# Patient Record
Sex: Female | Born: 1940 | Race: White | Hispanic: No | Smoking: Never smoker
Health system: Southern US, Community
[De-identification: ages and names within clinical notes are randomized; demographics above are authoritative.]

## PROBLEM LIST (undated history)

## (undated) DIAGNOSIS — I639 Cerebral infarction, unspecified: Secondary | ICD-10-CM

## (undated) DIAGNOSIS — I6381 Other cerebral infarction due to occlusion or stenosis of small artery: Secondary | ICD-10-CM

## (undated) DIAGNOSIS — R001 Bradycardia, unspecified: Secondary | ICD-10-CM

## (undated) DIAGNOSIS — F0390 Unspecified dementia without behavioral disturbance: Secondary | ICD-10-CM

## (undated) DIAGNOSIS — G250 Essential tremor: Secondary | ICD-10-CM

## (undated) DIAGNOSIS — G629 Polyneuropathy, unspecified: Secondary | ICD-10-CM

## (undated) DIAGNOSIS — F419 Anxiety disorder, unspecified: Secondary | ICD-10-CM

## (undated) DIAGNOSIS — R569 Unspecified convulsions: Secondary | ICD-10-CM

## (undated) DIAGNOSIS — I10 Essential (primary) hypertension: Secondary | ICD-10-CM

## (undated) DIAGNOSIS — F4323 Adjustment disorder with mixed anxiety and depressed mood: Secondary | ICD-10-CM

## (undated) DIAGNOSIS — E119 Type 2 diabetes mellitus without complications: Secondary | ICD-10-CM

## (undated) DIAGNOSIS — M069 Rheumatoid arthritis, unspecified: Secondary | ICD-10-CM

## (undated) HISTORY — DX: Unspecified convulsions: R56.9

## (undated) HISTORY — DX: Essential (primary) hypertension: I10

## (undated) HISTORY — DX: Essential tremor: G25.0

## (undated) HISTORY — PX: SMALL INTESTINE SURGERY: SHX150

## (undated) HISTORY — DX: Unspecified dementia, unspecified severity, without behavioral disturbance, psychotic disturbance, mood disturbance, and anxiety: F03.90

## (undated) HISTORY — DX: Polyneuropathy, unspecified: G62.9

## (undated) HISTORY — DX: Adjustment disorder with mixed anxiety and depressed mood: F43.23

## (undated) HISTORY — DX: Type 2 diabetes mellitus without complications: E11.9

## (undated) HISTORY — DX: Bradycardia, unspecified: R00.1

## (undated) HISTORY — DX: Anxiety disorder, unspecified: F41.9

## (undated) HISTORY — DX: Rheumatoid arthritis, unspecified: M06.9

## (undated) HISTORY — DX: Other cerebral infarction due to occlusion or stenosis of small artery: I63.81

## (undated) HISTORY — DX: Cerebral infarction, unspecified: I63.9

---

## 1973-04-08 HISTORY — PX: OTHER SURGICAL HISTORY: SHX169

## 2019-04-09 DIAGNOSIS — G20A1 Parkinson's disease without dyskinesia, without mention of fluctuations: Secondary | ICD-10-CM

## 2019-04-09 DIAGNOSIS — G3183 Dementia with Lewy bodies: Secondary | ICD-10-CM | POA: Insufficient documentation

## 2019-04-09 DIAGNOSIS — F0280 Dementia in other diseases classified elsewhere without behavioral disturbance: Secondary | ICD-10-CM | POA: Insufficient documentation

## 2019-04-09 DIAGNOSIS — G20 Parkinson's disease: Secondary | ICD-10-CM

## 2019-04-09 HISTORY — DX: Parkinson's disease: G20

## 2019-04-09 HISTORY — DX: Parkinson's disease without dyskinesia, without mention of fluctuations: G20.A1

## 2020-09-12 DIAGNOSIS — F419 Anxiety disorder, unspecified: Secondary | ICD-10-CM | POA: Diagnosis not present

## 2020-09-12 DIAGNOSIS — J45909 Unspecified asthma, uncomplicated: Secondary | ICD-10-CM | POA: Diagnosis not present

## 2020-09-12 DIAGNOSIS — I10 Essential (primary) hypertension: Secondary | ICD-10-CM | POA: Diagnosis not present

## 2020-09-12 DIAGNOSIS — G20 Parkinson's disease: Secondary | ICD-10-CM | POA: Diagnosis not present

## 2020-09-12 DIAGNOSIS — K219 Gastro-esophageal reflux disease without esophagitis: Secondary | ICD-10-CM | POA: Diagnosis not present

## 2020-09-12 DIAGNOSIS — R569 Unspecified convulsions: Secondary | ICD-10-CM | POA: Diagnosis not present

## 2020-09-12 DIAGNOSIS — I679 Cerebrovascular disease, unspecified: Secondary | ICD-10-CM | POA: Diagnosis not present

## 2020-09-12 DIAGNOSIS — I2510 Atherosclerotic heart disease of native coronary artery without angina pectoris: Secondary | ICD-10-CM | POA: Diagnosis not present

## 2020-09-12 DIAGNOSIS — E7800 Pure hypercholesterolemia, unspecified: Secondary | ICD-10-CM | POA: Diagnosis not present

## 2020-10-11 DIAGNOSIS — K219 Gastro-esophageal reflux disease without esophagitis: Secondary | ICD-10-CM | POA: Diagnosis not present

## 2020-10-11 DIAGNOSIS — I679 Cerebrovascular disease, unspecified: Secondary | ICD-10-CM | POA: Diagnosis not present

## 2020-10-11 DIAGNOSIS — G20 Parkinson's disease: Secondary | ICD-10-CM | POA: Diagnosis not present

## 2020-10-11 DIAGNOSIS — I2510 Atherosclerotic heart disease of native coronary artery without angina pectoris: Secondary | ICD-10-CM | POA: Diagnosis not present

## 2020-10-11 DIAGNOSIS — R69 Illness, unspecified: Secondary | ICD-10-CM | POA: Diagnosis not present

## 2020-10-11 DIAGNOSIS — E1159 Type 2 diabetes mellitus with other circulatory complications: Secondary | ICD-10-CM | POA: Diagnosis not present

## 2020-10-11 DIAGNOSIS — R569 Unspecified convulsions: Secondary | ICD-10-CM | POA: Diagnosis not present

## 2020-10-11 DIAGNOSIS — E7800 Pure hypercholesterolemia, unspecified: Secondary | ICD-10-CM | POA: Diagnosis not present

## 2020-10-11 DIAGNOSIS — I10 Essential (primary) hypertension: Secondary | ICD-10-CM | POA: Diagnosis not present

## 2020-10-11 DIAGNOSIS — J45909 Unspecified asthma, uncomplicated: Secondary | ICD-10-CM | POA: Diagnosis not present

## 2020-10-25 DIAGNOSIS — R69 Illness, unspecified: Secondary | ICD-10-CM | POA: Diagnosis not present

## 2020-11-02 DIAGNOSIS — I10 Essential (primary) hypertension: Secondary | ICD-10-CM | POA: Diagnosis not present

## 2020-11-02 DIAGNOSIS — Z794 Long term (current) use of insulin: Secondary | ICD-10-CM | POA: Diagnosis not present

## 2020-11-02 DIAGNOSIS — E119 Type 2 diabetes mellitus without complications: Secondary | ICD-10-CM | POA: Diagnosis not present

## 2020-11-02 DIAGNOSIS — H25813 Combined forms of age-related cataract, bilateral: Secondary | ICD-10-CM | POA: Diagnosis not present

## 2020-11-02 DIAGNOSIS — H524 Presbyopia: Secondary | ICD-10-CM | POA: Diagnosis not present

## 2020-11-02 DIAGNOSIS — H52223 Regular astigmatism, bilateral: Secondary | ICD-10-CM | POA: Diagnosis not present

## 2020-11-08 DIAGNOSIS — K219 Gastro-esophageal reflux disease without esophagitis: Secondary | ICD-10-CM | POA: Diagnosis not present

## 2020-11-08 DIAGNOSIS — G20 Parkinson's disease: Secondary | ICD-10-CM | POA: Diagnosis not present

## 2020-11-08 DIAGNOSIS — E7800 Pure hypercholesterolemia, unspecified: Secondary | ICD-10-CM | POA: Diagnosis not present

## 2020-11-08 DIAGNOSIS — R69 Illness, unspecified: Secondary | ICD-10-CM | POA: Diagnosis not present

## 2020-11-08 DIAGNOSIS — I679 Cerebrovascular disease, unspecified: Secondary | ICD-10-CM | POA: Diagnosis not present

## 2020-11-08 DIAGNOSIS — I10 Essential (primary) hypertension: Secondary | ICD-10-CM | POA: Diagnosis not present

## 2020-11-08 DIAGNOSIS — I2510 Atherosclerotic heart disease of native coronary artery without angina pectoris: Secondary | ICD-10-CM | POA: Diagnosis not present

## 2020-11-08 DIAGNOSIS — E1159 Type 2 diabetes mellitus with other circulatory complications: Secondary | ICD-10-CM | POA: Diagnosis not present

## 2020-11-08 DIAGNOSIS — R569 Unspecified convulsions: Secondary | ICD-10-CM | POA: Diagnosis not present

## 2020-11-08 DIAGNOSIS — J45909 Unspecified asthma, uncomplicated: Secondary | ICD-10-CM | POA: Diagnosis not present

## 2020-11-15 ENCOUNTER — Encounter: Payer: Self-pay | Admitting: Neurology

## 2020-11-15 ENCOUNTER — Ambulatory Visit: Payer: Medicare HMO | Admitting: Neurology

## 2020-11-15 ENCOUNTER — Telehealth: Payer: Self-pay | Admitting: Neurology

## 2020-11-15 VITALS — BP 128/75 | HR 64 | Ht 66.0 in | Wt 133.0 lb

## 2020-11-15 DIAGNOSIS — E079 Disorder of thyroid, unspecified: Secondary | ICD-10-CM | POA: Diagnosis not present

## 2020-11-15 DIAGNOSIS — W19XXXA Unspecified fall, initial encounter: Secondary | ICD-10-CM

## 2020-11-15 DIAGNOSIS — G250 Essential tremor: Secondary | ICD-10-CM

## 2020-11-15 DIAGNOSIS — Z131 Encounter for screening for diabetes mellitus: Secondary | ICD-10-CM | POA: Diagnosis not present

## 2020-11-15 DIAGNOSIS — R269 Unspecified abnormalities of gait and mobility: Secondary | ICD-10-CM

## 2020-11-15 DIAGNOSIS — G309 Alzheimer's disease, unspecified: Secondary | ICD-10-CM | POA: Diagnosis not present

## 2020-11-15 DIAGNOSIS — E538 Deficiency of other specified B group vitamins: Secondary | ICD-10-CM | POA: Diagnosis not present

## 2020-11-15 DIAGNOSIS — R69 Illness, unspecified: Secondary | ICD-10-CM | POA: Diagnosis not present

## 2020-11-15 DIAGNOSIS — G20 Parkinson's disease: Secondary | ICD-10-CM

## 2020-11-15 DIAGNOSIS — R413 Other amnesia: Secondary | ICD-10-CM | POA: Diagnosis not present

## 2020-11-15 DIAGNOSIS — R799 Abnormal finding of blood chemistry, unspecified: Secondary | ICD-10-CM | POA: Diagnosis not present

## 2020-11-15 DIAGNOSIS — E519 Thiamine deficiency, unspecified: Secondary | ICD-10-CM | POA: Diagnosis not present

## 2020-11-15 DIAGNOSIS — E539 Vitamin B deficiency, unspecified: Secondary | ICD-10-CM | POA: Diagnosis not present

## 2020-11-15 DIAGNOSIS — F0391 Unspecified dementia with behavioral disturbance: Secondary | ICD-10-CM | POA: Diagnosis not present

## 2020-11-15 DIAGNOSIS — R251 Tremor, unspecified: Secondary | ICD-10-CM
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
## 2020-11-15 NOTE — Patient Instructions (Addendum)
MRI of the brain FDG PET Scan  Blood work Formal neurocognitive testing  Review the different dementias below, it is unclear which diagnosis until the testing above:   Lewy Body Dementia Lewy body dementia, also called dementia with Lewy bodies, is a condition that affects the way the brain functions. It is one form of dementia. In this condition, proteins called Lewy bodies build up in certain areas of the brain. This causes problems with: Memory. Decision making. Behavior. Speaking. Thinking and problem solving. Movements and balance. This condition is progressive, which means that it gets worse with time (is degenerative) and cannot be reversed. What are the causes? This condition is caused by the buildup of Lewy bodies in brain cells in areas of the brain that control memory, thinking, and movement. It is not known whatcauses the Lewy bodies to build up. What increases the risk? You are more likely to develop this condition if you: Have a family history of Lewy body dementia or Parkinson's disease. Are 30 years old or older. Are female. What are the signs or symptoms? Symptoms of this condition may include: Seeing things that are not there (hallucinating). Sleep problems, such as acting out dreams while you are asleep. Changes in memory, attention, and concentration that come and go (fluctuate). Symptoms of dementia, such as: Trouble with memory. Trouble paying attention. Problems with planning and organizing. Problems with judgment. Behavioral problems. Symptoms of Parkinson's disease, such as: Shaking movements that you cannot control (tremor). Tremors usually start in a hand or foot when you are resting (resting tremor). Stooped posture. Slowing of movement. Stiff muscles (rigidity). Loss of balance and stability when standing. How is this diagnosed? This condition is diagnosed by a specialist who diagnoses and treats this condition (neurologist). Your health care  provider will talk with you and your family, friends, orcaregivers about your history and symptoms. A thorough medical history will be taken, and you will have a physical exam and tests. Tests may include: Lab tests, such as blood or urine tests. Imaging tests, such as a CT scan, a PET scan, or an MRI. A test that involves removing and testing a small amount of the fluid that surrounds the brain and spinal cord (lumbar puncture). Tests that evaluate brain function, such as memory tests, cognitive tests, and neuropsychological tests. How is this treated? There is no cure for this condition. Treatment focuses on managing your symptoms. Treatment may include: Medicines to help with hallucinations, sleep, and behavioral problems. Speech, occupational, and physical therapy. Your health care provider can help direct you to support groups, organizations,and other health care providers who can help with decisions about your care. Follow these instructions at home: Medicines Take over-the-counter and prescription medicines only as told by your health care provider. To help you manage your medicines, use a pill organizer or pill reminder. Avoid taking medicines that can affect thinking, such as pain medicines or certain sleeping medicines. Always check with your health care provider before taking any new medicines. Lifestyle Make healthy lifestyle choices: Be physically active as told by your health care provider. Do not use any products that contain nicotine or tobacco, such as cigarettes, e-cigarettes, and chewing tobacco. If you need help quitting, ask your health care provider. Try to practice stress-management techniques when you experience stress, such as mindfulness, yoga, or deep breathing. Stay socially connected. Talk regularly with other people, such as family, friends, and neighbors. Make sure you sleep well. These tips can help you get a good night's rest:  Avoid napping during the  day. Keep your sleeping area dark and cool. Avoid exercising a few hours before you go to bed. Avoid caffeine products in the evening. Eating and drinking Do not drink alcohol. Drink enough fluid to keep your urine pale yellow. Eat a healthy diet. Safety  Work with your health care provider to determine what you need help with and what your safety needs are. If you have trouble moving around, use a cane or walker as told by your health care provider. Make sure your home environment is safe. To do this: Remove things that can be a tripping hazard, such as throw rugs or clutter. Install grab bars and railings in your home to prevent falls. Talk with your health care provider about whether it is safe for you to drive. If you were given a bracelet that identifies you as a person with memory loss or tracks your location, make sure to wear it at all times.  General instructions  Work with your family to make important decisions, such as advance directives, medical power of attorney, or a living will. Keep all follow-up visits. This is important.  Where to find more information [employer]: [employer] Contact a health care provider if you have: New or worsening confusion. New or worsening trouble with sleeping or increased daytime sleepiness. Problems with choking or swallowing. Get help right away if: You feel depressed or sad, or feel that you want to harm yourself. Your family members become concerned for your safety. If you ever feel like you may hurt yourself or others, or have thoughts about taking your own life, get help right away. Go to your nearest emergency department or: Call your local emergency services (911 in the [place] Call a suicide crisis helpline, such as the National Suicide Prevention Lifeline at 914-175-8979. This is open 24 hours a day in the [place] Text the Crisis Text Line at (865)245-7938 (in the [place] Summary Lewy  body dementia is a condition that affects the way the brain functions. It causes problems with functions such as memory and thinking. Lewy body dementia gets worse with time. This condition is caused by the buildup of proteins called Lewy bodies in brain cells. It is not known what causes the Lewy bodies to build up. Work with your health care provider to determine what you need help with and what your safety needs are. Your health care provider can help direct you to support groups, organizations, and other health care providers who can help with decisions about your care. This information is not intended to replace advice given to you by your health care provider. Make sure you discuss any questions you have with your healthcare provider. Document Revised: 08/09/2019 Document Reviewed: 08/09/2019 Elsevier Patient Education  2022 [employer] Frontotemporal Dementia  Frontotemporal dementia (FTD), sometimes called semantic dementia or Pick's disease, is a progressive brain disorder that causes memory loss. FTD describes a range of diseases that often start with changes in behavior, speech, and thinking. As FTD progresses, it affects short-term memory. Over time, FTD causes the frontal and temporal lobes of the brain to shrink. These are theparts of the brain that control behavior and speech. There are multiple types of FTD, including: Behavioral variant FTD. This is the most common type. Primary progressive aphasia. Frontotemporal dementia with motor neuron disease. FTD is one of the most common causes of progressive memory loss in people younger than age 40. The disease progresses faster in some people  than in others. In some families, FTD can be associated with amyotrophic lateral sclerosis (ALS). There is no cure for FTD, but treatment and supportive carecan improve a person's quality of life. What are the causes? The exact cause of this condition is not known, although many genetic changes  (mutations) are known to cause the disease. What increases the risk? This condition is more likely to occur in people who have a family history ofFTD. Family members of people with FTD should think about genetic counseling. What are the signs or symptoms? Symptoms of FTD usually start when a person is 51-8 years of age. Symptoms may include: Impulsive, poorly controlled, inappropriate, or embarrassing behavior. Lack of motivation and interest (apathy). Irritability and agitation. Neglect of personal hygiene. Withdrawal. Inappropriate crying or laughing (pseudobulbar affect). Repetitive behaviors. Impulsive eating. Lack of concern for others. Failure to recognize behavioral changes. Speech problems, such as: Loss of the ability to speak fluently. Slurred speech. Loss of vocabulary. Inability to write or read. New drug or alcohol abuse. Short-term memory loss is also a symptom later in the disease. How is this diagnosed? Your health care provider may suspect FTD if you have worsening behavior or speech difficulties. You may need to see specialists in brain and behavioral health who will collect your medical history and do a neurological exam. The following tests may be done: Blood tests to rule out other causes, such as vitamin deficiency, harmful effects of substances (toxicities), and infections. Spinal tap (lumbar puncture) to check spinal fluid samples for abnormal proteins. Imaging tests, such as a CT scan, a PET scan, or an MRI. These can show brain changes that suggest FTD or another brain disorder. Memory testing (neuropsychological testing), which involves several hours of standardized tests to check the many functions of the brain. How is this treated? There is no cure for this condition, and the progression of FTD cannot be stopped. Support at home is the most important aspect of managing FTD. Askabout caregiving resources in your community. Management of this condition may  include: Antidepressant medicines to help with apathy. Medicines to treat pseudobulbar affect. Sedative medicines to control aggressive or dangerous behavior. Speech and language therapy. Behavioral therapy. Occupational therapy to help with home safety and activity. Institutional or supportive care may eventually be needed. Follow these instructions at home: Eating and drinking Follow a healthy diet. Eat foods that are high in fiber, such as beans, whole grains, and fresh fruits and vegetables. Avoid having too much sugar and caffeine in your diet. Watch for signs of compulsive eating, which can lead to other health problems. Do not drink alcohol. Drink enough fluid to keep your urine pale yellow. Lifestyle Keep a regular routine. Avoid stress and new situations. Avoid any activities that may trigger aggressive behavior. Schedule regular, enjoyable, and supervised physical activity. General instructions Take over-the-counter and prescription medicines only as told by your health care provider. If you were given a bracelet that tracks your location, make sure you wear it. Work with your health care provider to determine what you need help with and what your safety needs are. Keep all follow-up visits, including any therapy visits. This is important. Where to find more information [employer]: [employer] Contact a health care provider if: Your symptoms change or get worse. It becomes more difficult or stressful to be cared for at home. Get help right away if: It is no longer possible to be cared for at home. You or your family  members become concerned for your safety. You threaten yourself or others. If you ever feel like you may hurt yourself or others, or have thoughts about taking your own life, get help right away. Go to your nearest emergency department or: Call your local emergency services (911 in the [place] Call a suicide  crisis helpline, such as the National Suicide Prevention Lifeline at 435-636-8458. This is open 24 hours a day in the [place] Text the Crisis Text Line at 902-725-7730 (in the [place] Summary Frontotemporal dementia (FTD) is a progressive brain disorder that causes memory loss. There is no cure for this condition, and the progression of FTD cannot be stopped. Support at home is the most important aspect of managing FTD. Ask about caregiving resources in your community. Work with your health care provider to determine what you need help with and what your safety needs are. This information is not intended to replace advice given to you by your health care provider. Make sure you discuss any questions you have with your healthcare provider. Document Revised: 08/09/2019 Document Reviewed: 08/09/2019 Elsevier Patient Education  2022 [employer]  Alzheimer's Disease Alzheimer's disease is a brain disease that affects memory, thinking, language, and behavior. People with Alzheimer's disease lose mental abilities, and [place] gets worse over time. Alzheimer's disease is a form of dementia. What are the causes? This condition develops when a protein called beta-amyloid forms deposits inthe brain. It is not known what causes these deposits to form. Alzheimer's disease may also be caused by a gene mutation that is inherited from one parent or both parents. A gene mutation is a harmful change in a gene.Not everyone who inherits the genetic mutation will get the disease. What increases the risk? You are more likely to develop this condition if you: Are older than age 73. Are female. Have any of these medical conditions: High blood pressure. Diabetes. Heart or blood vessel disease. Smoke. Have obesity. Have had a brain injury. Have had a stroke. Have a family history of dementia. What are the signs or symptoms? Symptoms of this condition may happen in three stages, which often overlap. Early  stage In this stage, you may continue to be independent. You may still be able to drive, work, and be social. Symptoms in this stage include: Minor memory problems, such as forgetting a name, words, or what you did recently. Difficulty with: Paying attention. Communicating. Doing familiar tasks. Problem solving or doing calculations. Following instructions. Learning new things. Anxiety. Social withdrawal. Loss of motivation. Moderate stage In this stage, you will start to need care. Symptoms in this stage include: Difficulty with expressing thoughts. Memory loss that affects daily life. This can include forgetting: Recent events that have happened. If you have taken medicines or eaten. Familiar places. You may get lost while walking or driving. To pay bills or manage finances. Personal hygiene such as bathing or using the bathroom. Confusion about where you are or what time it is. Difficulty in judging distance. Changes in personality, mood, and behavior. You may be moody, irritable, angry, frustrated, fearful, anxious, or suspicious. Poor reasoning and judgment. Delusions or hallucinations. Changes in sleep patterns. Severe stage In the final stage, you will need help with your personal care and daily activities. Symptoms in this stage include: Worsening memory loss. Personality changes. Loss of awareness of your surroundings. Changes in physical abilities, including the ability to walk, sit, and swallow. Difficulty in communicating. Inability to control your bladder and bowels. Increasing confusion. Increasing behavior changes. How  is this diagnosed?  This condition is diagnosed by a health care provider who specializes in diseases of the nervous system (neurologist) or one who specializes in care of the elderly (geriatrician or geriatric psychiatrist). Other causes of dementia may also be ruled out. Your health care provider will talk with you and your family, friends, or  caregivers about your historyand symptoms. A thorough medical history will be taken, and you will have a physical exam and tests. Tests may include: Lab tests, such as blood or urine tests. Imaging tests, such as a CT scan, a PET scan, or an MRI. A lumbar puncture. This test involves removing and testing a small amount of the fluid that surrounds the brain and spinal cord. An electroencephalogram (EEG). In this test, small metal discs are used to measure electrical activity in the brain. Memory tests, cognitive tests, and neuropsychological tests. These tests evaluate brain function. Genetic testing. This may be done if you have early onset of the disease (before age 66) or if other family members have the disease. How is this treated? At this time, there is no treatment to cure Alzheimer's disease or stop it from getting worse. The goals of treatment are: To manage behavioral changes. To provide you with a safe environment. To help manage daily life for you and your caregivers. The following treatment options are available: Medicines. Medicines may help the memory work better and manage behavioral symptoms. Cognitive therapy. Cognitive therapy provides you with education, support, and memory aids. It is most helpful in the early stages of the condition. Counseling or spiritual guidance. It is normal to have a lot of feelings, including anger, relief, fear, and isolation. Counseling and guidance can help you deal with these feelings. Caregiving. This involves having caregivers help you with your daily activities. Family support groups. These provide education, emotional support, and information about community resources to family members who are taking care of you. Follow these instructions at home:  Medicines Take over-the-counter and prescription medicines only as told by your health care provider. Use a pill organizer or pill reminder to help you manage your medicines. Avoid taking  medicines that can affect thinking, such as pain medicines or sleeping medicines. Lifestyle Make healthy lifestyle choices: Be physically active as told by your health care provider. Regular exercise may help improve symptoms. Do not use any products that contain nicotine or tobacco, such as cigarettes, e-cigarettes, and chewing tobacco. If you need help quitting, ask your health care provider. Do not drink alcohol. Eat a healthy diet. Practice stress-management techniques when you get stressed. Stay social. Drink enough fluid to keep your urine pale yellow. Make sure to get quality sleep. Avoid taking long naps during the day. Take short naps of 30 minutes or less if needed. Keep your sleeping area dark and cool. Avoid exercising during the few hours before you go to bed. Avoid caffeine products in the afternoon and evening. General instructions Work with your health care provider to determine what you need help with and what your safety needs are. If you were given a bracelet that identifies you as a person with memory loss or tracks your location, make sure to wear it at all times. Talk with your health care provider about whether it is safe for you to drive. Work with your family to make important decisions, such as advance directives, medical power of attorney, or a living will. Keep all follow-up visits. This is important. Where to find more information The [employer]: Call  the 24-hour helpline at 1-217 670 4529, or visit [employer] Contact a health care provider if: You have nausea, vomiting, or trouble with eating related to a medicine. You have worsening mood or behavior changes, such as depression, anxiety, or hallucinations. You or your family members become concerned for your safety. Get help right away if: You become less responsive or are difficult to wake up. Your memory suddenly gets worse. You feel that you want to harm yourself. If you ever feel like you  may hurt yourself or others, or have thoughts about taking your own life, get help right away. Go to your nearest emergency department or: Call your local emergency services (911 in the [place] Call a suicide crisis helpline, such as the National Suicide Prevention Lifeline at (303) 067-8480. This is open 24 hours a day in the [place] Text the Crisis Text Line at 7623666941 (in the [place] Summary Alzheimer's disease is a brain disease that affects memory, thinking, language, and behavior. Alzheimer's disease is a form of dementia. This condition is diagnosed by a specialist in diseases of the nervous system (neurologist) or one who specializes in care of the elderly. At this time, there is no treatment to cure Alzheimer's disease or stop it from getting worse. The goal of treatment is to help you manage any symptoms. Work with your family to make important decisions, such as advance directives, medical power of attorney, or a living will. This information is not intended to replace advice given to you by your health care provider. Make sure you discuss any questions you have with your healthcare provider. Document Revised: 07/12/2019 Document Reviewed: 07/12/2019 Elsevier Patient Education  2022 [employer]   Vascular Dementia Dementia is a condition in which a person has problems with thinking, memory, and behavior that are severe enough to interfere with daily life. Vascular dementia is a type of dementia. It results from brain damage that is caused by the brain not getting enough blood. This condition may also be called vascularcognitive impairment. What are the causes? Vascular dementia is caused by conditions that reduce blood flow to the brain. Common causes of this condition include: Multiple small strokes. These may happen without symptoms (silent stroke). Major stroke. Damage to small blood vessels in the brain (cerebral small vessel disease). What increases the risk? The following factors  may make you more likely to develop this condition: Having had a stroke. Having high blood pressure (hypertension) or high cholesterol. Having a disease that affects the heart or blood vessels. Smoking. Not being active. Being over age 75. Having any of these conditions: Diabetes. Metabolic syndrome. Obesity. Depression. A genetic condition that leads to stroke, such as CADASIL (cerebral autosomal dominant arteriopathy with subcortical infarcts and leukoencephalopathy). What are the signs or symptoms? Symptoms of vascular dementia can vary from one person to another. Symptoms may be mild or severe depending on the amount of damage and which parts of thebrain have been affected. Symptoms may begin suddenly or may develop slowly. Mental symptoms may include: Confusion and memory problems. Poor attention and concentration. Trouble understanding speech. Depression. Personality changes. Trouble recognizing familiar people. Agitation or aggression. Paranoia or false beliefs (delusions). Hallucinations. These involve seeing, hearing, tasting, smelling, or feeling things that are not real. Physical symptoms may include: Weakness. Poor balance. Loss of bladder or bowel control (incontinence). Unsteady walking (gait). Speaking problems. Behavioral symptoms may include: Getting lost in familiar places. Problems with planning and judgment. Trouble following instructions. Social problems. Emotional outbursts. Trouble with daily activities and self-care.  Problems handling money. Symptoms may remain stable, or they may get worse over time. Symptoms of vascular dementia may be similar to those of Alzheimer's disease. The two conditions can occur together (mixed dementia). How is this diagnosed? This condition may be diagnosed based on: Your medical history and a physical exam. Symptoms or changes that are reported by friends and family. Lab tests or other tests that check brain and  nervous system function. Tests may include: Blood tests. Brain imaging tests. Tests of movement, speech, and other daily activities (neurological exam). Tests of memory, thinking, and problem-solving (neuropsychological or neurocognitive testing). There is not a specific test to diagnose vascular dementia. Diagnosis may involve several specialists. These may include: A health care provider who specializes in the brain and nervous system (neurologist). A health care provider who specializes in understanding how problems in the brain can alter behavior and cognitive function (neuropsychologist). How is this treated? There is no cure for vascular dementia. Brain damage that has already occurred cannot be reversed. Treatment depends on: How severe the condition is. Which parts of your brain have been affected. Your overall health. Treatment measures aim to: Treat the underlying cause of vascular dementia and manage risk factors. This may include: Controlling blood pressure or lowering cholesterol. Treating diabetes. Making lifestyle changes, such as quitting smoking or losing weight. Manage symptoms. Prevent further brain damage. Improve the person's health and quality of life. Treatment for dementia may involve a team of health care providers, including: A neurologist. A provider who specializes in disorders of the mind (psychiatrist). A provider who specializes in helping people learn daily living skills (occupational therapist). A provider who focuses on speech and language changes (Doctor, general practice). A heart specialist (cardiologist). A provider who helps people learn how to manage physical changes, such as movement and walking (exercise physiologist or physical therapist). Follow these instructions at home: Medicines Take over-the-counter and prescription medicines only as told by your health care provider. Use a pill organizer or pill reminder to help you manage your  medicines. Lifestyle Do not use any products that contain nicotine or tobacco, such as cigarettes, e-cigarettes, and chewing tobacco. If you need help quitting, ask your health care provider. Eat a healthy, balanced diet. Maintain a healthy weight, or lose weight if needed. Be physically active as told by your health care provider. General instructions Work with your health care provider to determine what you need help with and what your safety needs are. Follow the health care provider's instructions for treating the condition that caused the dementia. Keep all follow-up visits. This is important. If you are the caregiver:  People with vascular dementia may need regular help at home or daily care froma family member or home health care worker. Home care for a person with vascular dementia depends on what caused the condition and how severe the symptoms are. General guidelines for caregivers include: Help the person with dementia remember people, appointments, and daily activities. Help the person with dementia manage his or her medicines. Help family and friends learn about ways to communicate with the person with dementia. Create a safe living space to reduce the risk of injury or falls. Find a support group to help caregivers and family cope with the effects of dementia.  Where to find more information [employer] of Neurological Disorders and Stroke: [employer] Contact a health care provider if: New behavioral problems develop. Problems with swallowing develop. Confusion gets worse. Sleepiness gets worse. Get help right away if: Loss of consciousness  occurs. There is a sudden loss of speech, balance, or thinking ability. New numbness or paralysis occurs. Sudden, severe headache occurs. Vision is lost or suddenly gets worse in one or both eyes. If you ever feel like the person with dementia may hurt himself or herself or others, or if he or she shares thoughts about  taking his or her own life, get help right away. You can go to your nearest emergency department or: Call your local emergency services (911 in the [place] Call a suicide crisis helpline, such as the National Suicide Prevention Lifeline at (608)626-2260. This is open 24 hours a day in the [place] Text the Crisis Text Line at 607-456-2790 (in the [place] Summary Vascular dementia is a type of dementia. It results from brain damage that is caused by the brain not getting enough blood. Vascular dementia is caused by conditions that reduce blood flow to the brain. Common causes of this condition include stroke and damage to small blood vessels in the brain. Treatment focuses on treating the underlying cause of vascular dementia and managing any risk factors. People with vascular dementia may need regular help at home or daily care from a family member or home health care worker. Contact a health care provider if you or your caregiver notices any new symptoms. This information is not intended to replace advice given to you by your health care provider. Make sure you discuss any questions you have with your healthcare provider. Document Revised: 08/09/2019 Document Reviewed: 08/09/2019 Elsevier Patient Education  2022 [employer].

## 2020-11-15 NOTE — Telephone Encounter (Signed)
[employer] medicare order sent to [facility]. They will obtain the auth and reach out to the patient to schedule.  °

## 2020-11-16 ENCOUNTER — Telehealth: Payer: Self-pay | Admitting: Neurology

## 2020-11-16 NOTE — Telephone Encounter (Signed)
[employer] medicare pending faxed notes  °

## 2020-11-18 LAB — COMPREHENSIVE METABOLIC PANEL
ALT: 7 IU/L (ref 0–32)
AST: 19 IU/L (ref 0–40)
Albumin/Globulin Ratio: 2.1 (ref 1.2–2.2)
Albumin: 4.7 g/dL (ref 3.7–4.7)
Alkaline Phosphatase: 76 IU/L (ref 44–121)
BUN/Creatinine Ratio: 32 — ABNORMAL HIGH (ref 12–28)
BUN: 18 mg/dL (ref 8–27)
Bilirubin Total: <0.2 mg/dL (ref 0.0–1.2)
CO2: 27 mmol/L (ref 20–29)
Calcium: 9.6 mg/dL (ref 8.7–10.3)
Chloride: 101 mmol/L (ref 96–106)
Creatinine, Ser: 0.56 mg/dL — ABNORMAL LOW (ref 0.57–1.00)
Globulin, Total: 2.2 g/dL (ref 1.5–4.5)
Glucose: 138 mg/dL — ABNORMAL HIGH (ref 65–99)
Potassium: 4.8 mmol/L (ref 3.5–5.2)
Sodium: 139 mmol/L (ref 134–144)
Total Protein: 6.9 g/dL (ref 6.0–8.5)
eGFR: 93 mL/min/{1.73_m2} (ref >59)

## 2020-11-18 LAB — HEMOGLOBIN A1C
Est. average glucose Bld gHb Est-mCnc: 177 mg/dL
Hgb A1c MFr Bld: 7.8 % — ABNORMAL HIGH (ref 4.8–5.6)

## 2020-11-18 LAB — CBC WITH DIFFERENTIAL/PLATELET
Basophils Absolute: 0 10*3/uL (ref 0.0–0.2)
Basos: 1 %
EOS (ABSOLUTE): 0.2 10*3/uL (ref 0.0–0.4)
Eos: 5 %
Hematocrit: 43.7 % (ref 34.0–46.6)
Hemoglobin: 14.1 g/dL (ref 11.1–15.9)
Immature Grans (Abs): 0 10*3/uL (ref 0.0–0.1)
Immature Granulocytes: 0 %
Lymphocytes Absolute: 1.9 10*3/uL (ref 0.7–3.1)
Lymphs: 42 %
MCH: 28.8 pg (ref 26.6–33.0)
MCHC: 32.3 g/dL (ref 31.5–35.7)
MCV: 89 fL (ref 79–97)
Monocytes Absolute: 0.4 10*3/uL (ref 0.1–0.9)
Monocytes: 9 %
Neutrophils Absolute: 1.9 10*3/uL (ref 1.4–7.0)
Neutrophils: 43 %
Platelets: 221 10*3/uL (ref 150–450)
RBC: 4.89 x10E6/uL (ref 3.77–5.28)
RDW: 12.1 % (ref 11.7–15.4)
WBC: 4.4 10*3/uL (ref 3.4–10.8)

## 2020-11-18 LAB — VITAMIN B1: Thiamine: 143.7 nmol/L (ref 66.5–200.0)

## 2020-11-18 LAB — TSH: TSH: 1.8 u[IU]/mL (ref 0.450–4.500)

## 2020-11-18 LAB — B12 AND FOLATE PANEL
Folate: 8.6 ng/mL (ref >3.0)
Vitamin B-12: 408 pg/mL (ref 232–1245)

## 2020-11-18 LAB — HOMOCYSTEINE: Homocysteine: 8.8 umol/L (ref 0.0–19.2)

## 2020-11-18 LAB — METHYLMALONIC ACID, SERUM: Methylmalonic Acid: 124 nmol/L (ref 0–378)

## 2020-11-20 NOTE — Telephone Encounter (Signed)
I called to check the status it is still pending.  °

## 2020-11-21 ENCOUNTER — Encounter: Payer: Self-pay | Admitting: Psychology

## 2020-11-22 ENCOUNTER — Ambulatory Visit
Admission: RE | Admit: 2020-11-22 | Discharge: 2020-11-22 | Disposition: A | Discharge status: Home / Self Care | Payer: Medicare HMO | Source: Ambulatory Visit | Attending: Neurology | Admitting: Neurology

## 2020-11-22 DIAGNOSIS — G250 Essential tremor: Secondary | ICD-10-CM | POA: Diagnosis not present

## 2020-11-22 DIAGNOSIS — R413 Other amnesia: Secondary | ICD-10-CM | POA: Diagnosis not present

## 2020-11-22 DIAGNOSIS — R69 Illness, unspecified: Secondary | ICD-10-CM | POA: Diagnosis not present

## 2020-11-22 DIAGNOSIS — F0391 Unspecified dementia with behavioral disturbance: Secondary | ICD-10-CM

## 2020-11-22 DIAGNOSIS — G309 Alzheimer's disease, unspecified: Secondary | ICD-10-CM | POA: Diagnosis not present

## 2020-11-22 IMAGING — MR MR HEAD WO/W CM
13 series · 48 of 48 positions shown · IV contrast (multihance)
Comparison: No pertinent prior exams available for comparison.

CLINICAL DATA: Short-term memory loss B6Y.T (EZT-T8-CM). Memory
Loss. Essential tremor 706.P (EZT-T8-CM). Alzheimer's disease,
unspecified (CODE) (HCC) R07.E (EZT-T8-CM)Dementia with behavioral
disturbance, unspecified dementia type (HCC) TKO.2E (EZT-T8-CM).
Additional history provided by scanning technologist: Recent
short-term memory loss, essential tremor.

EXAM:
MRI HEAD WITHOUT AND WITH CONTRAST
TECHNIQUE: Multiplanar, multiecho pulse sequences of the brain and surrounding
structures were obtained without and with intravenous contrast.
CONTRAST:  12mL MULTIHANCE GADOBENATE DIMEGLUMINE 529 MG/ML IV SOLN

[Series 2: T1 · sagittal · 5.0mm · 0.45mm/px · 1 of 25 slices shown]
[im 1/25]
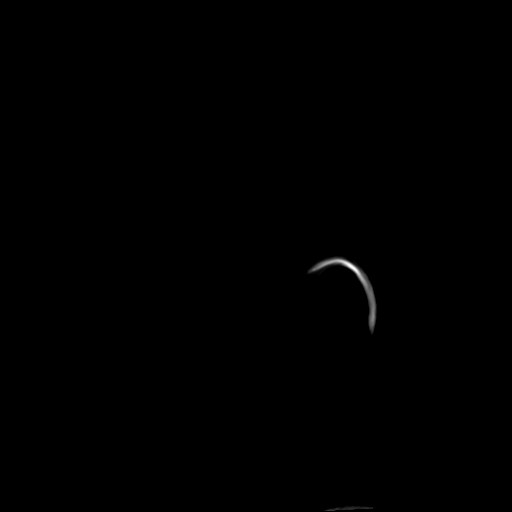

[Series 3: ax ep2d_diff_3 · axial · 3.0mm · 1.80mm/px · z∈[-43,+118]mm · 6 of 109 slices shown]
[im 1/109]
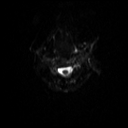
[im 22/109]
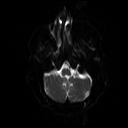
[im 44/109]
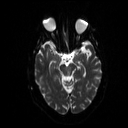
[im 65/109]
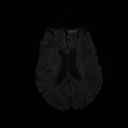
[im 87/109]
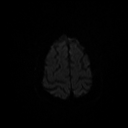
[im 109/109]
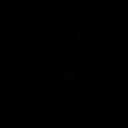

[Series 4: ax ep2d_diff_3_adc · axial · 3.0mm · 1.80mm/px · z∈[-43,+118]mm · 3 of 55 slices shown]
[im 1/55]
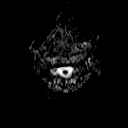
[im 28/55]
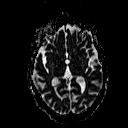
[im 55/55]
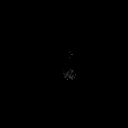

[Series 5: cor ep2d_diff · coronal · 5.0mm · 1.77mm/px · 3 of 60 slices shown]
[im 1/60]
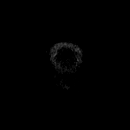
[im 30/60]
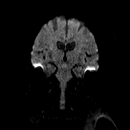
[im 60/60]
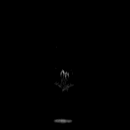

[Series 6: cor ep2d_diff_adc · coronal · 5.0mm · 1.77mm/px · 2 of 30 slices shown]
[im 1/30]
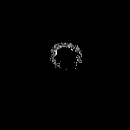
[im 30/30]
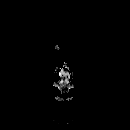

[Series 7: mip_images(sw) · axial · 16.0mm · 0.98mm/px · z∈[-33,+110]mm · 4 of 73 slices shown]
[im 1/73]
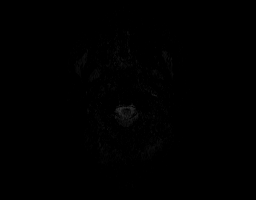
[im 25/73]
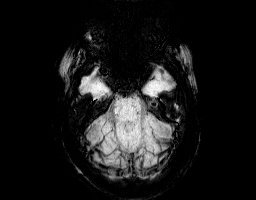
[im 49/73]
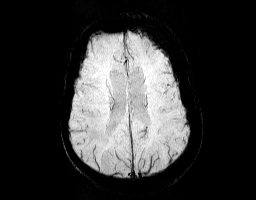
[im 73/73]
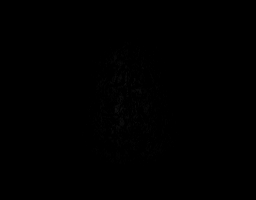

[Series 8: swi_images · axial · 2.0mm · 0.98mm/px · z∈[-40,+117]mm · 4 of 80 slices shown]
[im 1/80]
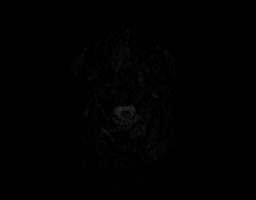
[im 27/80]
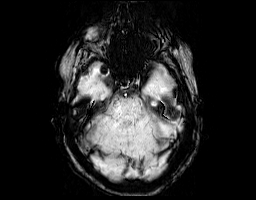
[im 53/80]
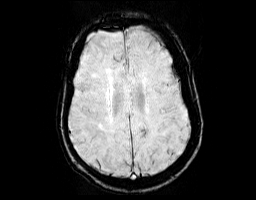
[im 80/80]
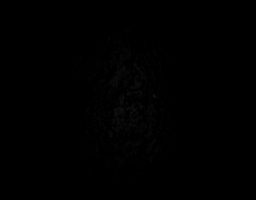

[Series 9: FLAIR · axial · 3.0mm · 0.43mm/px · z∈[-36,+115]mm · 2 of 40 slices shown]
[im 1/40]
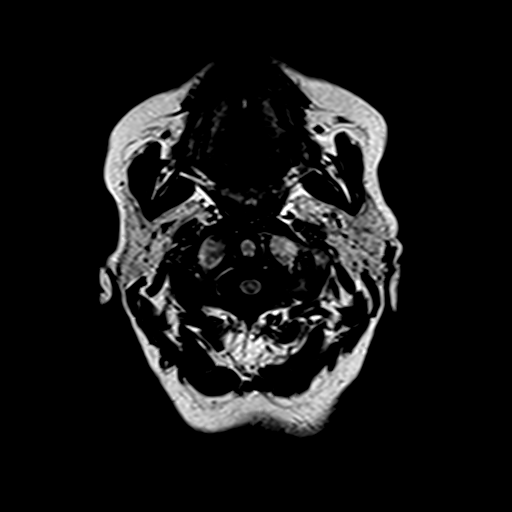
[im 40/40]
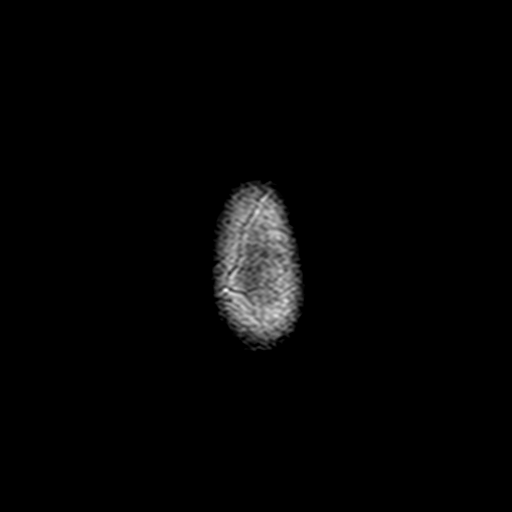

[Series 10: T2 · axial · 5.0mm · 0.65mm/px · z∈[-43,+124]mm · 2 of 29 slices shown (1 of 2)]
[im 1/29]
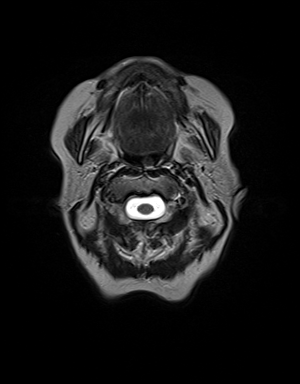
[im 29/29]
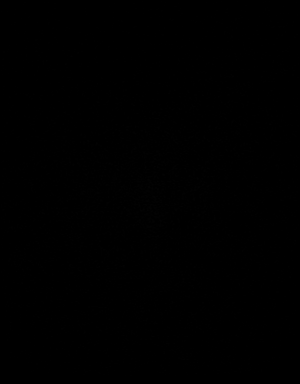

[Series 11: t1_mpr_(person_name) · axial · 1.0mm · 0.72mm/px · z∈[-40,+118]mm · 9 of 160 slices shown]
[im 1/160]
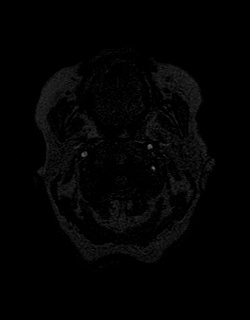
[im 20/160]
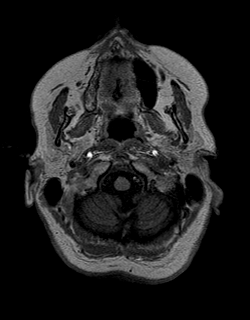
[im 40/160]
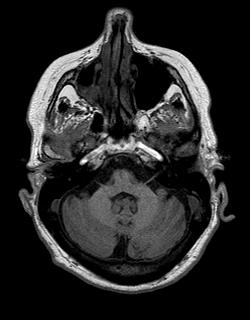
[im 60/160]
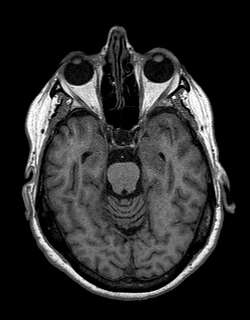
[im 80/160]
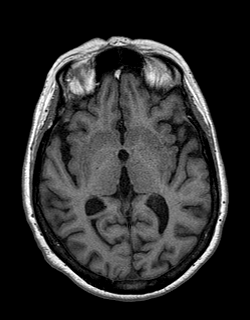
[im 100/160]
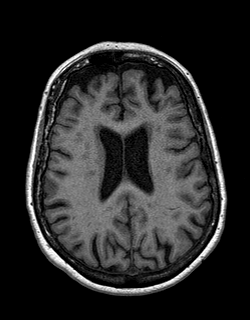
[im 120/160]
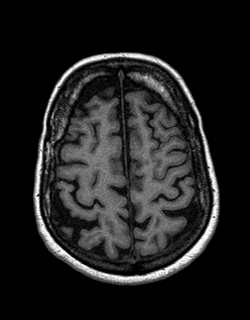
[im 140/160]
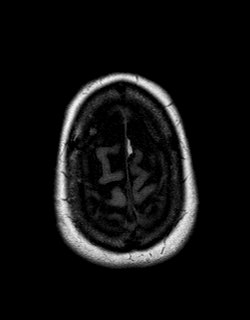
[im 160/160]
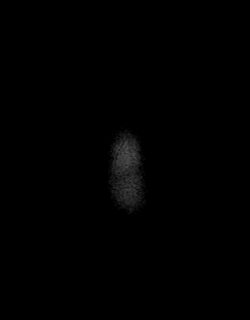

[Series 12: T2 · coronal · 5.0mm · 0.45mm/px · 2 of 31 slices shown (2 of 2)]
[im 1/31]
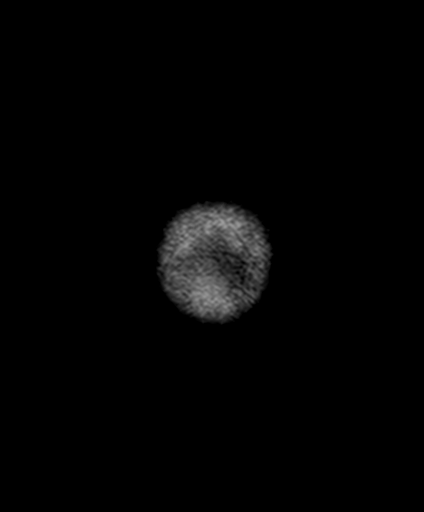
[im 31/31]
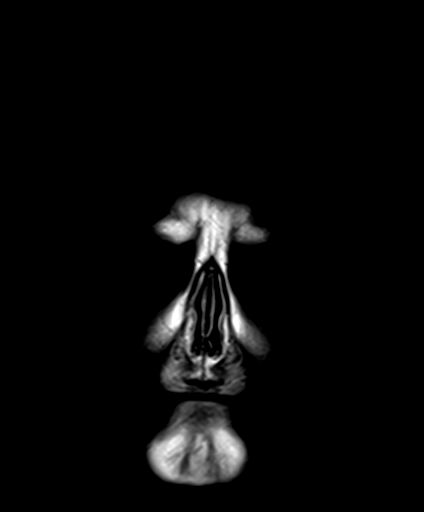

[Series 13: T1 post-contrast · coronal · 5.0mm · 0.72mm/px · 1 of 26 slices shown]
[im 1/26]
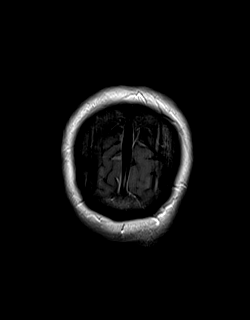

[Series 14: post t1_mpr_(person_name) · axial · 1.0mm · 0.72mm/px · z∈[-40,+118]mm · 9 of 160 slices shown]
[im 1/160]
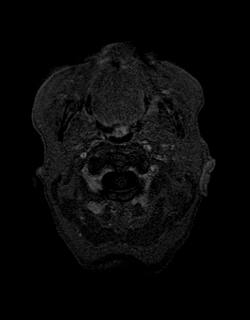
[im 20/160]
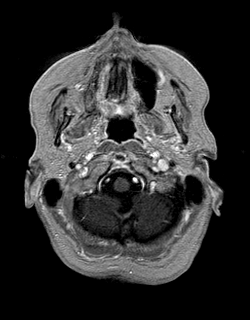
[im 40/160]
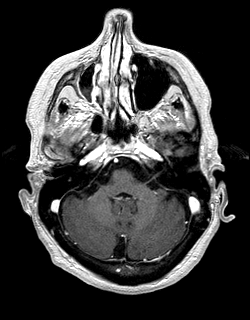
[im 60/160]
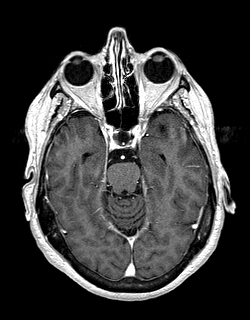
[im 80/160]
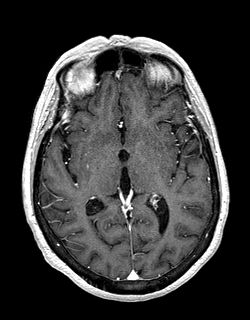
[im 100/160]
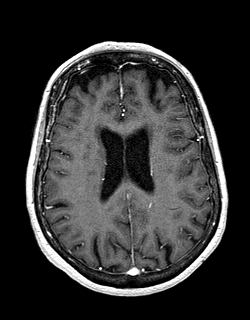
[im 120/160]
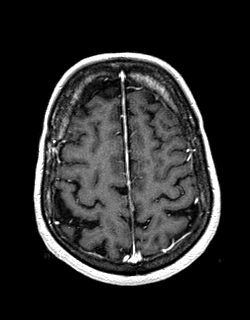
[im 140/160]
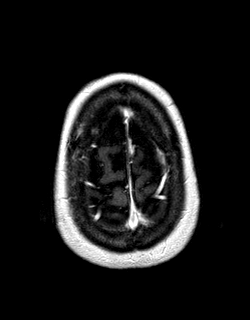
[im 160/160]
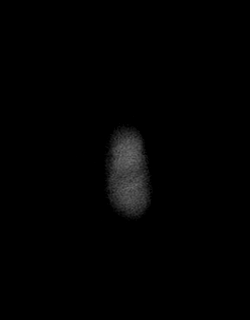

[48 of 48 positions shown; findings below may reference images not displayed]

FINDINGS: Brain:

Mild generalized cerebral atrophy.

Mild for age multifocal T2/FLAIR hyperintensity within the cerebral
white matter, nonspecific but compatible with chronic small vessel
ischemic disease. Minimal chronic small-vessel ischemic changes are
also present within the pons.

Small chronic lacunar infarcts within the bilateral basal ganglia.

There is no acute infarct.

No evidence of an intracranial mass.

No chronic intracranial blood products.

No extra-axial fluid collection.

No midline shift.

Partially empty sella turcica.

No abnormal intracranial enhancement.

Vascular: Maintained flow voids within the proximal large arterial
vessels. Left parietal lobe developmental venous anomaly (an
incidental anatomic variant).

Skull and upper cervical spine: No focal suspicious marrow lesion.

Sinuses/Orbits: Visualized orbits show no acute finding. Moderate
mucosal thickening and fluid within an asymmetrically diminutive
right maxillary sinus.
IMPRESSION: No evidence of acute intracranial abnormality.

Mild chronic small vessel ischemic changes within the cerebral white
matter and pons.

Small chronic lacunar infarcts within the bilateral basal ganglia.

Mild generalized cerebral atrophy.

Moderate mucosal thickening and fluid within an asymmetrically
diminutive right maxillary sinus.

## 2020-11-22 MED ORDER — GADOBENATE DIMEGLUMINE 529 MG/ML IV SOLN
12.0000 mL | Freq: Once | INTRAVENOUS | Status: AC | PRN
  Administered 2020-11-22: 12 mL via INTRAVENOUS

## 2020-11-22 NOTE — Telephone Encounter (Signed)
[employer] medicare did not approve the PET scan and there is not option to do a peer to peer. It has to be an appeal through [place].. I am waiting for the results from the MRI that she has scheduled at [facility] for 11/22/20 to fax along with the appeal.

## 2020-11-23 ENCOUNTER — Telehealth: Payer: Self-pay | Admitting: Neurology

## 2020-11-23 NOTE — Telephone Encounter (Signed)
Spoke with Gina Lozano at [facility]. They are able to accept patient's home health referral. They will reach out to the patient to schedule. [name]'s contact #: [phone].

## 2020-11-24 DIAGNOSIS — R69 Illness, unspecified: Secondary | ICD-10-CM | POA: Diagnosis not present

## 2020-11-24 DIAGNOSIS — I10 Essential (primary) hypertension: Secondary | ICD-10-CM | POA: Diagnosis not present

## 2020-11-24 DIAGNOSIS — G20 Parkinson's disease: Secondary | ICD-10-CM | POA: Diagnosis not present

## 2020-11-24 DIAGNOSIS — Z794 Long term (current) use of insulin: Secondary | ICD-10-CM | POA: Diagnosis not present

## 2020-11-24 DIAGNOSIS — I4891 Unspecified atrial fibrillation: Secondary | ICD-10-CM | POA: Diagnosis not present

## 2020-11-24 DIAGNOSIS — E1136 Type 2 diabetes mellitus with diabetic cataract: Secondary | ICD-10-CM | POA: Diagnosis not present

## 2020-11-24 DIAGNOSIS — M069 Rheumatoid arthritis, unspecified: Secondary | ICD-10-CM | POA: Diagnosis not present

## 2020-11-24 DIAGNOSIS — E1140 Type 2 diabetes mellitus with diabetic neuropathy, unspecified: Secondary | ICD-10-CM | POA: Diagnosis not present

## 2020-11-24 DIAGNOSIS — G309 Alzheimer's disease, unspecified: Secondary | ICD-10-CM | POA: Diagnosis not present

## 2020-11-24 DIAGNOSIS — I69354 Hemiplegia and hemiparesis following cerebral infarction affecting left non-dominant side: Secondary | ICD-10-CM | POA: Diagnosis not present

## 2020-11-27 ENCOUNTER — Telehealth: Payer: Self-pay | Admitting: *Deleted

## 2020-11-27 DIAGNOSIS — E1136 Type 2 diabetes mellitus with diabetic cataract: Secondary | ICD-10-CM | POA: Diagnosis not present

## 2020-11-27 DIAGNOSIS — I69354 Hemiplegia and hemiparesis following cerebral infarction affecting left non-dominant side: Secondary | ICD-10-CM | POA: Diagnosis not present

## 2020-11-27 DIAGNOSIS — I10 Essential (primary) hypertension: Secondary | ICD-10-CM | POA: Diagnosis not present

## 2020-11-27 DIAGNOSIS — Z794 Long term (current) use of insulin: Secondary | ICD-10-CM | POA: Diagnosis not present

## 2020-11-27 DIAGNOSIS — G309 Alzheimer's disease, unspecified: Secondary | ICD-10-CM | POA: Diagnosis not present

## 2020-11-27 DIAGNOSIS — I4891 Unspecified atrial fibrillation: Secondary | ICD-10-CM | POA: Diagnosis not present

## 2020-11-27 DIAGNOSIS — G20 Parkinson's disease: Secondary | ICD-10-CM | POA: Diagnosis not present

## 2020-11-27 DIAGNOSIS — M069 Rheumatoid arthritis, unspecified: Secondary | ICD-10-CM | POA: Diagnosis not present

## 2020-11-27 DIAGNOSIS — R69 Illness, unspecified: Secondary | ICD-10-CM | POA: Diagnosis not present

## 2020-11-27 DIAGNOSIS — E1140 Type 2 diabetes mellitus with diabetic neuropathy, unspecified: Secondary | ICD-10-CM | POA: Diagnosis not present

## 2020-11-27 NOTE — Telephone Encounter (Signed)
I spoke with patient's granddaughter Victorino Dike (on [place]) and discussed pt's lab results which indicate patient's A1c is 7.8 and it should be less than 5.6.  Victorino Dike stated she was able to send [place] a MyChart and the patient will be seeing primary care on the first.  She will discuss this with PCP along with cholesterol.  The patient is taking a baby aspirin.  Her questions were answered.  We also reviewed the MRI brain results again.  Her questions were answered to her satisfaction.  She stated she will personally make sure the patient completed the next 3 appointments for the formal memory testing.  She verbalized appreciation for the call.

## 2020-11-27 NOTE — Telephone Encounter (Signed)
-----   Message from Anson Fret, MD sent at 11/26/2020  7:51 PM EDT ----- Please call patient and let her know, she did not read her mychart message. Patient has diabetes, hgba1c is 7.8. (should be < 5.6) and this needs to be addressed with primary care. Thanks, Dr. Lucia Gaskins

## 2020-11-27 NOTE — Telephone Encounter (Signed)
I spoke with Victorino Dike (on DPR), the patient's granddaughter.  She stated her grandfather will pay cash for the PET scan.  She understands we do not have an exact cost, possibly upwards of $2000.  She will discuss the cost directly with the hospital.  She verbalized appreciation and I let her know a message will be sent to get the scheduling process started.

## 2020-11-27 NOTE — Telephone Encounter (Signed)
[employer] medicare did not approve the PET scan.  "Based on Medicare national coverage determinations for dementia and neurodegenerative diseases, we cannot approve this request. Your healthcare provider told us that you may have a problem that affects your brain. The request cannot be approved because: you must have been diagnosed (found to have) with dementia (a loss of memory that progresses over time).   It must be needed to differentiate between a diagnosis of frontotemporal dementia and Alzheimer's disease in order to help guide future treatment. Both conditions are long term disease that cause memory loss."   There is no option for a peer to peer. The only option is an appeal with [employer]. A letter would need to be written stating why this test is needed. Also if you want to do the fast appeal which we hear back within 72 hours or the standard appeal we will hear back within 30 days.

## 2020-11-28 NOTE — Telephone Encounter (Signed)
Noted, I messaged [name] with [facility] informing her that it is ready to be scheduled and that the insurance did not approve it but the family is going to pay out of pocket for the exam.

## 2020-11-30 DIAGNOSIS — R69 Illness, unspecified: Secondary | ICD-10-CM | POA: Diagnosis not present

## 2020-12-01 DIAGNOSIS — R69 Illness, unspecified: Secondary | ICD-10-CM | POA: Diagnosis not present

## 2020-12-01 DIAGNOSIS — G20 Parkinson's disease: Secondary | ICD-10-CM | POA: Diagnosis not present

## 2020-12-01 DIAGNOSIS — E1140 Type 2 diabetes mellitus with diabetic neuropathy, unspecified: Secondary | ICD-10-CM | POA: Diagnosis not present

## 2020-12-01 DIAGNOSIS — E1136 Type 2 diabetes mellitus with diabetic cataract: Secondary | ICD-10-CM | POA: Diagnosis not present

## 2020-12-01 DIAGNOSIS — M069 Rheumatoid arthritis, unspecified: Secondary | ICD-10-CM | POA: Diagnosis not present

## 2020-12-01 DIAGNOSIS — Z794 Long term (current) use of insulin: Secondary | ICD-10-CM | POA: Diagnosis not present

## 2020-12-01 DIAGNOSIS — I4891 Unspecified atrial fibrillation: Secondary | ICD-10-CM | POA: Diagnosis not present

## 2020-12-01 DIAGNOSIS — G309 Alzheimer's disease, unspecified: Secondary | ICD-10-CM | POA: Diagnosis not present

## 2020-12-01 DIAGNOSIS — I10 Essential (primary) hypertension: Secondary | ICD-10-CM | POA: Diagnosis not present

## 2020-12-01 DIAGNOSIS — I69354 Hemiplegia and hemiparesis following cerebral infarction affecting left non-dominant side: Secondary | ICD-10-CM | POA: Diagnosis not present

## 2020-12-05 DIAGNOSIS — I4891 Unspecified atrial fibrillation: Secondary | ICD-10-CM | POA: Diagnosis not present

## 2020-12-05 DIAGNOSIS — Z794 Long term (current) use of insulin: Secondary | ICD-10-CM | POA: Diagnosis not present

## 2020-12-05 DIAGNOSIS — E1136 Type 2 diabetes mellitus with diabetic cataract: Secondary | ICD-10-CM | POA: Diagnosis not present

## 2020-12-05 DIAGNOSIS — R69 Illness, unspecified: Secondary | ICD-10-CM | POA: Diagnosis not present

## 2020-12-05 DIAGNOSIS — G20 Parkinson's disease: Secondary | ICD-10-CM | POA: Diagnosis not present

## 2020-12-05 DIAGNOSIS — I69354 Hemiplegia and hemiparesis following cerebral infarction affecting left non-dominant side: Secondary | ICD-10-CM | POA: Diagnosis not present

## 2020-12-05 DIAGNOSIS — E1140 Type 2 diabetes mellitus with diabetic neuropathy, unspecified: Secondary | ICD-10-CM | POA: Diagnosis not present

## 2020-12-05 DIAGNOSIS — I10 Essential (primary) hypertension: Secondary | ICD-10-CM | POA: Diagnosis not present

## 2020-12-05 DIAGNOSIS — G309 Alzheimer's disease, unspecified: Secondary | ICD-10-CM | POA: Diagnosis not present

## 2020-12-05 DIAGNOSIS — M069 Rheumatoid arthritis, unspecified: Secondary | ICD-10-CM | POA: Diagnosis not present

## 2020-12-12 DIAGNOSIS — Z794 Long term (current) use of insulin: Secondary | ICD-10-CM | POA: Diagnosis not present

## 2020-12-12 DIAGNOSIS — R45851 Suicidal ideations: Secondary | ICD-10-CM | POA: Diagnosis not present

## 2020-12-12 DIAGNOSIS — R69 Illness, unspecified: Secondary | ICD-10-CM | POA: Diagnosis not present

## 2020-12-12 DIAGNOSIS — T887XXA Unspecified adverse effect of drug or medicament, initial encounter: Secondary | ICD-10-CM | POA: Diagnosis not present

## 2020-12-12 DIAGNOSIS — R0902 Hypoxemia: Secondary | ICD-10-CM | POA: Diagnosis not present

## 2020-12-12 DIAGNOSIS — Z66 Do not resuscitate: Secondary | ICD-10-CM | POA: Diagnosis not present

## 2020-12-12 DIAGNOSIS — E785 Hyperlipidemia, unspecified: Secondary | ICD-10-CM | POA: Diagnosis not present

## 2020-12-12 DIAGNOSIS — I4891 Unspecified atrial fibrillation: Secondary | ICD-10-CM | POA: Diagnosis not present

## 2020-12-12 DIAGNOSIS — T424X1A Poisoning by benzodiazepines, accidental (unintentional), initial encounter: Secondary | ICD-10-CM | POA: Diagnosis not present

## 2020-12-12 DIAGNOSIS — I252 Old myocardial infarction: Secondary | ICD-10-CM | POA: Diagnosis not present

## 2020-12-12 DIAGNOSIS — E11649 Type 2 diabetes mellitus with hypoglycemia without coma: Secondary | ICD-10-CM | POA: Diagnosis not present

## 2020-12-12 DIAGNOSIS — E161 Other hypoglycemia: Secondary | ICD-10-CM | POA: Diagnosis not present

## 2020-12-12 DIAGNOSIS — E119 Type 2 diabetes mellitus without complications: Secondary | ICD-10-CM | POA: Diagnosis not present

## 2020-12-12 DIAGNOSIS — Z79899 Other long term (current) drug therapy: Secondary | ICD-10-CM | POA: Diagnosis not present

## 2020-12-12 DIAGNOSIS — J449 Chronic obstructive pulmonary disease, unspecified: Secondary | ICD-10-CM | POA: Diagnosis not present

## 2020-12-12 DIAGNOSIS — T424X4A Poisoning by benzodiazepines, undetermined, initial encounter: Secondary | ICD-10-CM | POA: Diagnosis not present

## 2020-12-12 DIAGNOSIS — E162 Hypoglycemia, unspecified: Secondary | ICD-10-CM | POA: Diagnosis not present

## 2020-12-12 DIAGNOSIS — I10 Essential (primary) hypertension: Secondary | ICD-10-CM | POA: Diagnosis not present

## 2020-12-12 DIAGNOSIS — T50904A Poisoning by unspecified drugs, medicaments and biological substances, undetermined, initial encounter: Secondary | ICD-10-CM | POA: Diagnosis not present

## 2020-12-13 DIAGNOSIS — I4891 Unspecified atrial fibrillation: Secondary | ICD-10-CM | POA: Diagnosis not present

## 2020-12-13 DIAGNOSIS — R69 Illness, unspecified: Secondary | ICD-10-CM | POA: Diagnosis not present

## 2020-12-13 DIAGNOSIS — T424X1A Poisoning by benzodiazepines, accidental (unintentional), initial encounter: Secondary | ICD-10-CM | POA: Diagnosis not present

## 2020-12-13 DIAGNOSIS — I252 Old myocardial infarction: Secondary | ICD-10-CM | POA: Diagnosis not present

## 2020-12-14 DIAGNOSIS — T424X1A Poisoning by benzodiazepines, accidental (unintentional), initial encounter: Secondary | ICD-10-CM | POA: Diagnosis not present

## 2020-12-14 DIAGNOSIS — I252 Old myocardial infarction: Secondary | ICD-10-CM | POA: Diagnosis not present

## 2020-12-14 DIAGNOSIS — R69 Illness, unspecified: Secondary | ICD-10-CM | POA: Diagnosis not present

## 2020-12-14 DIAGNOSIS — I4891 Unspecified atrial fibrillation: Secondary | ICD-10-CM | POA: Diagnosis not present

## 2020-12-15 DIAGNOSIS — R69 Illness, unspecified: Secondary | ICD-10-CM | POA: Diagnosis not present

## 2020-12-15 DIAGNOSIS — I4891 Unspecified atrial fibrillation: Secondary | ICD-10-CM | POA: Diagnosis not present

## 2020-12-15 DIAGNOSIS — T424X1A Poisoning by benzodiazepines, accidental (unintentional), initial encounter: Secondary | ICD-10-CM | POA: Diagnosis not present

## 2020-12-15 DIAGNOSIS — I252 Old myocardial infarction: Secondary | ICD-10-CM | POA: Diagnosis not present

## 2020-12-16 DIAGNOSIS — I252 Old myocardial infarction: Secondary | ICD-10-CM | POA: Diagnosis not present

## 2020-12-16 DIAGNOSIS — R69 Illness, unspecified: Secondary | ICD-10-CM | POA: Diagnosis not present

## 2020-12-16 DIAGNOSIS — I4891 Unspecified atrial fibrillation: Secondary | ICD-10-CM | POA: Diagnosis not present

## 2020-12-16 DIAGNOSIS — T424X1A Poisoning by benzodiazepines, accidental (unintentional), initial encounter: Secondary | ICD-10-CM | POA: Diagnosis not present

## 2020-12-17 DIAGNOSIS — R69 Illness, unspecified: Secondary | ICD-10-CM | POA: Diagnosis not present

## 2020-12-17 DIAGNOSIS — I4891 Unspecified atrial fibrillation: Secondary | ICD-10-CM | POA: Diagnosis not present

## 2020-12-17 DIAGNOSIS — I252 Old myocardial infarction: Secondary | ICD-10-CM | POA: Diagnosis not present

## 2020-12-17 DIAGNOSIS — T424X1A Poisoning by benzodiazepines, accidental (unintentional), initial encounter: Secondary | ICD-10-CM | POA: Diagnosis not present

## 2020-12-18 DIAGNOSIS — T424X1A Poisoning by benzodiazepines, accidental (unintentional), initial encounter: Secondary | ICD-10-CM | POA: Diagnosis not present

## 2020-12-18 DIAGNOSIS — I4891 Unspecified atrial fibrillation: Secondary | ICD-10-CM | POA: Diagnosis not present

## 2020-12-18 DIAGNOSIS — I252 Old myocardial infarction: Secondary | ICD-10-CM | POA: Diagnosis not present

## 2020-12-18 DIAGNOSIS — R69 Illness, unspecified: Secondary | ICD-10-CM | POA: Diagnosis not present

## 2020-12-19 DIAGNOSIS — R69 Illness, unspecified: Secondary | ICD-10-CM | POA: Diagnosis not present

## 2020-12-19 DIAGNOSIS — I4891 Unspecified atrial fibrillation: Secondary | ICD-10-CM | POA: Diagnosis not present

## 2020-12-19 DIAGNOSIS — T424X1A Poisoning by benzodiazepines, accidental (unintentional), initial encounter: Secondary | ICD-10-CM | POA: Diagnosis not present

## 2020-12-19 DIAGNOSIS — I252 Old myocardial infarction: Secondary | ICD-10-CM | POA: Diagnosis not present

## 2020-12-20 DIAGNOSIS — I4891 Unspecified atrial fibrillation: Secondary | ICD-10-CM | POA: Diagnosis not present

## 2020-12-20 DIAGNOSIS — R69 Illness, unspecified: Secondary | ICD-10-CM | POA: Diagnosis not present

## 2020-12-20 DIAGNOSIS — I252 Old myocardial infarction: Secondary | ICD-10-CM | POA: Diagnosis not present

## 2020-12-20 DIAGNOSIS — T424X1A Poisoning by benzodiazepines, accidental (unintentional), initial encounter: Secondary | ICD-10-CM | POA: Diagnosis not present

## 2020-12-21 DIAGNOSIS — I4891 Unspecified atrial fibrillation: Secondary | ICD-10-CM | POA: Diagnosis not present

## 2020-12-21 DIAGNOSIS — R69 Illness, unspecified: Secondary | ICD-10-CM | POA: Diagnosis not present

## 2020-12-21 DIAGNOSIS — I252 Old myocardial infarction: Secondary | ICD-10-CM | POA: Diagnosis not present

## 2020-12-21 DIAGNOSIS — T424X1A Poisoning by benzodiazepines, accidental (unintentional), initial encounter: Secondary | ICD-10-CM | POA: Diagnosis not present

## 2020-12-22 DIAGNOSIS — I252 Old myocardial infarction: Secondary | ICD-10-CM | POA: Diagnosis not present

## 2020-12-22 DIAGNOSIS — R69 Illness, unspecified: Secondary | ICD-10-CM | POA: Diagnosis not present

## 2020-12-22 DIAGNOSIS — I4891 Unspecified atrial fibrillation: Secondary | ICD-10-CM | POA: Diagnosis not present

## 2020-12-22 DIAGNOSIS — T424X1A Poisoning by benzodiazepines, accidental (unintentional), initial encounter: Secondary | ICD-10-CM | POA: Diagnosis not present

## 2020-12-23 DIAGNOSIS — I4891 Unspecified atrial fibrillation: Secondary | ICD-10-CM | POA: Diagnosis not present

## 2020-12-23 DIAGNOSIS — T424X1A Poisoning by benzodiazepines, accidental (unintentional), initial encounter: Secondary | ICD-10-CM | POA: Diagnosis not present

## 2020-12-23 DIAGNOSIS — R69 Illness, unspecified: Secondary | ICD-10-CM | POA: Diagnosis not present

## 2020-12-23 DIAGNOSIS — I252 Old myocardial infarction: Secondary | ICD-10-CM | POA: Diagnosis not present

## 2020-12-24 DIAGNOSIS — I252 Old myocardial infarction: Secondary | ICD-10-CM | POA: Diagnosis not present

## 2020-12-24 DIAGNOSIS — T424X1A Poisoning by benzodiazepines, accidental (unintentional), initial encounter: Secondary | ICD-10-CM | POA: Diagnosis not present

## 2020-12-24 DIAGNOSIS — I4891 Unspecified atrial fibrillation: Secondary | ICD-10-CM | POA: Diagnosis not present

## 2020-12-24 DIAGNOSIS — R69 Illness, unspecified: Secondary | ICD-10-CM | POA: Diagnosis not present

## 2020-12-25 DIAGNOSIS — I252 Old myocardial infarction: Secondary | ICD-10-CM | POA: Diagnosis not present

## 2020-12-25 DIAGNOSIS — I4891 Unspecified atrial fibrillation: Secondary | ICD-10-CM | POA: Diagnosis not present

## 2020-12-25 DIAGNOSIS — T424X1A Poisoning by benzodiazepines, accidental (unintentional), initial encounter: Secondary | ICD-10-CM | POA: Diagnosis not present

## 2020-12-25 DIAGNOSIS — R69 Illness, unspecified: Secondary | ICD-10-CM | POA: Diagnosis not present

## 2020-12-26 DIAGNOSIS — K5900 Constipation, unspecified: Secondary | ICD-10-CM | POA: Diagnosis not present

## 2020-12-26 DIAGNOSIS — I10 Essential (primary) hypertension: Secondary | ICD-10-CM | POA: Diagnosis not present

## 2020-12-26 DIAGNOSIS — G20 Parkinson's disease: Secondary | ICD-10-CM | POA: Diagnosis not present

## 2020-12-26 DIAGNOSIS — E785 Hyperlipidemia, unspecified: Secondary | ICD-10-CM | POA: Diagnosis not present

## 2020-12-26 DIAGNOSIS — E11649 Type 2 diabetes mellitus with hypoglycemia without coma: Secondary | ICD-10-CM | POA: Diagnosis not present

## 2020-12-26 DIAGNOSIS — G300 Alzheimer's disease with early onset: Secondary | ICD-10-CM | POA: Diagnosis not present

## 2021-01-02 DIAGNOSIS — R69 Illness, unspecified: Secondary | ICD-10-CM | POA: Diagnosis not present

## 2021-01-02 DIAGNOSIS — F32A Depression, unspecified: Secondary | ICD-10-CM | POA: Diagnosis not present

## 2021-01-07 DIAGNOSIS — R0602 Shortness of breath: Secondary | ICD-10-CM | POA: Diagnosis not present

## 2021-01-08 DIAGNOSIS — R062 Wheezing: Secondary | ICD-10-CM | POA: Diagnosis not present

## 2021-01-09 DIAGNOSIS — Z743 Need for continuous supervision: Secondary | ICD-10-CM | POA: Diagnosis not present

## 2021-01-09 DIAGNOSIS — R109 Unspecified abdominal pain: Secondary | ICD-10-CM | POA: Diagnosis not present

## 2021-01-09 DIAGNOSIS — R69 Illness, unspecified: Secondary | ICD-10-CM | POA: Diagnosis not present

## 2021-01-09 DIAGNOSIS — R739 Hyperglycemia, unspecified: Secondary | ICD-10-CM | POA: Diagnosis not present

## 2021-01-09 DIAGNOSIS — R0902 Hypoxemia: Secondary | ICD-10-CM | POA: Diagnosis not present

## 2021-01-09 DIAGNOSIS — R069 Unspecified abnormalities of breathing: Secondary | ICD-10-CM | POA: Diagnosis not present

## 2021-01-09 DIAGNOSIS — R197 Diarrhea, unspecified: Secondary | ICD-10-CM | POA: Diagnosis not present

## 2021-01-09 DIAGNOSIS — Z20822 Contact with and (suspected) exposure to covid-19: Secondary | ICD-10-CM | POA: Diagnosis not present

## 2021-01-10 DIAGNOSIS — Z743 Need for continuous supervision: Secondary | ICD-10-CM | POA: Diagnosis not present

## 2021-01-10 DIAGNOSIS — R0902 Hypoxemia: Secondary | ICD-10-CM | POA: Diagnosis not present

## 2021-01-10 DIAGNOSIS — R1084 Generalized abdominal pain: Secondary | ICD-10-CM | POA: Diagnosis not present

## 2021-01-10 DIAGNOSIS — R110 Nausea: Secondary | ICD-10-CM | POA: Diagnosis not present

## 2021-01-12 DIAGNOSIS — E7849 Other hyperlipidemia: Secondary | ICD-10-CM | POA: Diagnosis not present

## 2021-01-12 DIAGNOSIS — Z79899 Other long term (current) drug therapy: Secondary | ICD-10-CM | POA: Diagnosis not present

## 2021-01-12 DIAGNOSIS — U071 COVID-19: Secondary | ICD-10-CM | POA: Diagnosis not present

## 2021-01-12 DIAGNOSIS — Z20828 Contact with and (suspected) exposure to other viral communicable diseases: Secondary | ICD-10-CM | POA: Diagnosis not present

## 2021-01-15 DIAGNOSIS — E038 Other specified hypothyroidism: Secondary | ICD-10-CM | POA: Diagnosis not present

## 2021-01-15 DIAGNOSIS — Z79899 Other long term (current) drug therapy: Secondary | ICD-10-CM | POA: Diagnosis not present

## 2021-01-15 DIAGNOSIS — E7849 Other hyperlipidemia: Secondary | ICD-10-CM | POA: Diagnosis not present

## 2021-01-17 DIAGNOSIS — E11649 Type 2 diabetes mellitus with hypoglycemia without coma: Secondary | ICD-10-CM | POA: Diagnosis not present

## 2021-01-17 DIAGNOSIS — E038 Other specified hypothyroidism: Secondary | ICD-10-CM | POA: Diagnosis not present

## 2021-01-17 DIAGNOSIS — E785 Hyperlipidemia, unspecified: Secondary | ICD-10-CM | POA: Diagnosis not present

## 2021-01-17 DIAGNOSIS — I10 Essential (primary) hypertension: Secondary | ICD-10-CM | POA: Diagnosis not present

## 2021-01-17 DIAGNOSIS — E1159 Type 2 diabetes mellitus with other circulatory complications: Secondary | ICD-10-CM | POA: Diagnosis not present

## 2021-01-17 DIAGNOSIS — D518 Other vitamin B12 deficiency anemias: Secondary | ICD-10-CM | POA: Diagnosis not present

## 2021-01-17 DIAGNOSIS — E119 Type 2 diabetes mellitus without complications: Secondary | ICD-10-CM | POA: Diagnosis not present

## 2021-01-17 DIAGNOSIS — G300 Alzheimer's disease with early onset: Secondary | ICD-10-CM | POA: Diagnosis not present

## 2021-01-18 DIAGNOSIS — R1110 Vomiting, unspecified: Secondary | ICD-10-CM | POA: Diagnosis not present

## 2021-01-18 DIAGNOSIS — K5900 Constipation, unspecified: Secondary | ICD-10-CM | POA: Diagnosis not present

## 2021-01-18 DIAGNOSIS — Z743 Need for continuous supervision: Secondary | ICD-10-CM | POA: Diagnosis not present

## 2021-01-18 DIAGNOSIS — R110 Nausea: Secondary | ICD-10-CM | POA: Diagnosis not present

## 2021-01-18 DIAGNOSIS — R109 Unspecified abdominal pain: Secondary | ICD-10-CM | POA: Diagnosis not present

## 2021-01-18 DIAGNOSIS — R1111 Vomiting without nausea: Secondary | ICD-10-CM | POA: Diagnosis not present

## 2021-01-18 DIAGNOSIS — Z20822 Contact with and (suspected) exposure to covid-19: Secondary | ICD-10-CM | POA: Diagnosis not present

## 2021-02-05 DIAGNOSIS — E1165 Type 2 diabetes mellitus with hyperglycemia: Secondary | ICD-10-CM | POA: Diagnosis not present

## 2021-02-08 DIAGNOSIS — F03918 Unspecified dementia, unspecified severity, with other behavioral disturbance: Secondary | ICD-10-CM | POA: Diagnosis not present

## 2021-02-08 DIAGNOSIS — R69 Illness, unspecified: Secondary | ICD-10-CM | POA: Diagnosis not present

## 2021-02-11 DIAGNOSIS — D518 Other vitamin B12 deficiency anemias: Secondary | ICD-10-CM | POA: Diagnosis not present

## 2021-02-11 DIAGNOSIS — E119 Type 2 diabetes mellitus without complications: Secondary | ICD-10-CM | POA: Diagnosis not present

## 2021-02-11 DIAGNOSIS — I10 Essential (primary) hypertension: Secondary | ICD-10-CM | POA: Diagnosis not present

## 2021-02-11 DIAGNOSIS — E11649 Type 2 diabetes mellitus with hypoglycemia without coma: Secondary | ICD-10-CM | POA: Diagnosis not present

## 2021-02-11 DIAGNOSIS — E785 Hyperlipidemia, unspecified: Secondary | ICD-10-CM | POA: Diagnosis not present

## 2021-02-11 DIAGNOSIS — E559 Vitamin D deficiency, unspecified: Secondary | ICD-10-CM | POA: Diagnosis not present

## 2021-02-11 DIAGNOSIS — G300 Alzheimer's disease with early onset: Secondary | ICD-10-CM | POA: Diagnosis not present

## 2021-02-11 DIAGNOSIS — E038 Other specified hypothyroidism: Secondary | ICD-10-CM | POA: Diagnosis not present

## 2021-02-13 DIAGNOSIS — G20 Parkinson's disease: Secondary | ICD-10-CM | POA: Diagnosis not present

## 2021-02-13 DIAGNOSIS — G300 Alzheimer's disease with early onset: Secondary | ICD-10-CM | POA: Diagnosis not present

## 2021-02-14 ENCOUNTER — Telehealth: Payer: Self-pay | Admitting: Neurology

## 2021-02-14 DIAGNOSIS — F32A Depression, unspecified: Secondary | ICD-10-CM | POA: Diagnosis not present

## 2021-02-14 DIAGNOSIS — F419 Anxiety disorder, unspecified: Secondary | ICD-10-CM | POA: Diagnosis not present

## 2021-02-14 DIAGNOSIS — R69 Illness, unspecified: Secondary | ICD-10-CM | POA: Diagnosis not present

## 2021-02-14 NOTE — Telephone Encounter (Signed)
Pt's granddaughter Victorino Dike called stating she is needing a letter for her grandmother stating she cannot make decisions for herself. She states her grandmother is currently in the nursing home. Victorino Dike is trying to keep her in there whereas there are some other family members who are trying to get her out to take money etc from her. Victorino Dike is requesting a call back.

## 2021-02-15 DIAGNOSIS — E559 Vitamin D deficiency, unspecified: Secondary | ICD-10-CM | POA: Diagnosis not present

## 2021-02-15 DIAGNOSIS — E038 Other specified hypothyroidism: Secondary | ICD-10-CM | POA: Diagnosis not present

## 2021-02-15 DIAGNOSIS — Z79899 Other long term (current) drug therapy: Secondary | ICD-10-CM | POA: Diagnosis not present

## 2021-02-15 DIAGNOSIS — E7849 Other hyperlipidemia: Secondary | ICD-10-CM | POA: Diagnosis not present

## 2021-02-15 DIAGNOSIS — E119 Type 2 diabetes mellitus without complications: Secondary | ICD-10-CM | POA: Diagnosis not present

## 2021-02-15 DIAGNOSIS — D518 Other vitamin B12 deficiency anemias: Secondary | ICD-10-CM | POA: Diagnosis not present

## 2021-02-15 NOTE — Telephone Encounter (Signed)
I called Gina Lozano, grand-daughter of pt on DPR.  She is asking for a letter for lawyer stating that pt is not able to make financial/legal decisions due to her diagnosis of dementia due to other family members are trying to have her released.  Pt is an AL facility. Gina Lozano states that she is HCPOA.  Has appt in Jan 2023 with [facility]. She stated that previous neurologist stated she had dementia as well.

## 2021-02-20 ENCOUNTER — Encounter: Payer: Self-pay | Admitting: *Deleted

## 2021-02-20 NOTE — Telephone Encounter (Signed)
I called grand-daughter Gina Lozano.   Letter is ready for pick up.  She will fax [place] email HCP[place] for [place] to file on Epic.  She stated that they were turned into STATE for placing pt in facility, not being taken care of.  She said that was overturned pt stating that she was taken care of (on tape).  They are needing letter for lawyer. I relayed needs to be signed and then can be picked up.  She appreciated call back.

## 2021-02-23 DIAGNOSIS — H25813 Combined forms of age-related cataract, bilateral: Secondary | ICD-10-CM | POA: Diagnosis not present

## 2021-02-23 DIAGNOSIS — H353131 Nonexudative age-related macular degeneration, bilateral, early dry stage: Secondary | ICD-10-CM | POA: Diagnosis not present

## 2021-02-23 DIAGNOSIS — Z01818 Encounter for other preprocedural examination: Secondary | ICD-10-CM | POA: Diagnosis not present

## 2021-02-23 DIAGNOSIS — H25812 Combined forms of age-related cataract, left eye: Secondary | ICD-10-CM | POA: Diagnosis not present

## 2021-02-23 DIAGNOSIS — H40033 Anatomical narrow angle, bilateral: Secondary | ICD-10-CM | POA: Diagnosis not present

## 2021-02-28 NOTE — Telephone Encounter (Signed)
Letter signed.  To [facility] to email and scan HCPOA to Epic chart.

## 2021-03-06 DIAGNOSIS — H40013 Open angle with borderline findings, low risk, bilateral: Secondary | ICD-10-CM | POA: Diagnosis not present

## 2021-03-06 DIAGNOSIS — E119 Type 2 diabetes mellitus without complications: Secondary | ICD-10-CM | POA: Diagnosis not present

## 2021-03-06 DIAGNOSIS — Z794 Long term (current) use of insulin: Secondary | ICD-10-CM | POA: Diagnosis not present

## 2021-03-06 DIAGNOSIS — H259 Unspecified age-related cataract: Secondary | ICD-10-CM | POA: Diagnosis not present

## 2021-03-06 DIAGNOSIS — E1136 Type 2 diabetes mellitus with diabetic cataract: Secondary | ICD-10-CM | POA: Diagnosis not present

## 2021-03-06 DIAGNOSIS — H25812 Combined forms of age-related cataract, left eye: Secondary | ICD-10-CM | POA: Diagnosis not present

## 2021-03-06 DIAGNOSIS — H40033 Anatomical narrow angle, bilateral: Secondary | ICD-10-CM | POA: Diagnosis not present

## 2021-03-06 DIAGNOSIS — I10 Essential (primary) hypertension: Secondary | ICD-10-CM | POA: Diagnosis not present

## 2021-03-07 DIAGNOSIS — E1165 Type 2 diabetes mellitus with hyperglycemia: Secondary | ICD-10-CM | POA: Diagnosis not present

## 2021-03-08 DIAGNOSIS — F32A Depression, unspecified: Secondary | ICD-10-CM | POA: Diagnosis not present

## 2021-03-08 DIAGNOSIS — R69 Illness, unspecified: Secondary | ICD-10-CM | POA: Diagnosis not present

## 2021-03-08 DIAGNOSIS — F419 Anxiety disorder, unspecified: Secondary | ICD-10-CM | POA: Diagnosis not present

## 2021-03-19 DIAGNOSIS — E785 Hyperlipidemia, unspecified: Secondary | ICD-10-CM | POA: Diagnosis not present

## 2021-03-19 DIAGNOSIS — I10 Essential (primary) hypertension: Secondary | ICD-10-CM | POA: Diagnosis not present

## 2021-03-19 DIAGNOSIS — E559 Vitamin D deficiency, unspecified: Secondary | ICD-10-CM | POA: Diagnosis not present

## 2021-03-19 DIAGNOSIS — E11649 Type 2 diabetes mellitus with hypoglycemia without coma: Secondary | ICD-10-CM | POA: Diagnosis not present

## 2021-03-19 DIAGNOSIS — G300 Alzheimer's disease with early onset: Secondary | ICD-10-CM | POA: Diagnosis not present

## 2021-03-19 DIAGNOSIS — E038 Other specified hypothyroidism: Secondary | ICD-10-CM | POA: Diagnosis not present

## 2021-03-19 DIAGNOSIS — D518 Other vitamin B12 deficiency anemias: Secondary | ICD-10-CM | POA: Diagnosis not present

## 2021-03-19 DIAGNOSIS — E119 Type 2 diabetes mellitus without complications: Secondary | ICD-10-CM | POA: Diagnosis not present

## 2021-03-21 DIAGNOSIS — F03918 Unspecified dementia, unspecified severity, with other behavioral disturbance: Secondary | ICD-10-CM | POA: Diagnosis not present

## 2021-03-21 DIAGNOSIS — R69 Illness, unspecified: Secondary | ICD-10-CM | POA: Diagnosis not present

## 2021-03-21 DIAGNOSIS — F419 Anxiety disorder, unspecified: Secondary | ICD-10-CM | POA: Diagnosis not present

## 2021-04-05 DIAGNOSIS — E1165 Type 2 diabetes mellitus with hyperglycemia: Secondary | ICD-10-CM | POA: Diagnosis not present

## 2021-04-10 ENCOUNTER — Other Ambulatory Visit: Payer: Self-pay

## 2021-04-10 ENCOUNTER — Ambulatory Visit: Payer: Medicare HMO | Admitting: Psychology

## 2021-04-10 ENCOUNTER — Encounter: Payer: Self-pay | Admitting: Psychology

## 2021-04-10 ENCOUNTER — Ambulatory Visit (INDEPENDENT_AMBULATORY_CARE_PROVIDER_SITE_OTHER): Payer: Medicare HMO | Admitting: Psychology

## 2021-04-10 DIAGNOSIS — F0390 Unspecified dementia without behavioral disturbance: Secondary | ICD-10-CM | POA: Insufficient documentation

## 2021-04-10 DIAGNOSIS — I6381 Other cerebral infarction due to occlusion or stenosis of small artery: Secondary | ICD-10-CM

## 2021-04-10 DIAGNOSIS — R001 Bradycardia, unspecified: Secondary | ICD-10-CM | POA: Insufficient documentation

## 2021-04-10 DIAGNOSIS — R69 Illness, unspecified: Secondary | ICD-10-CM | POA: Diagnosis not present

## 2021-04-10 DIAGNOSIS — M069 Rheumatoid arthritis, unspecified: Secondary | ICD-10-CM | POA: Insufficient documentation

## 2021-04-10 DIAGNOSIS — G250 Essential tremor: Secondary | ICD-10-CM | POA: Insufficient documentation

## 2021-04-10 DIAGNOSIS — R569 Unspecified convulsions: Secondary | ICD-10-CM | POA: Insufficient documentation

## 2021-04-10 DIAGNOSIS — E119 Type 2 diabetes mellitus without complications: Secondary | ICD-10-CM | POA: Insufficient documentation

## 2021-04-10 DIAGNOSIS — I10 Essential (primary) hypertension: Secondary | ICD-10-CM | POA: Insufficient documentation

## 2021-04-10 DIAGNOSIS — F4323 Adjustment disorder with mixed anxiety and depressed mood: Secondary | ICD-10-CM | POA: Diagnosis not present

## 2021-04-10 DIAGNOSIS — R4189 Other symptoms and signs involving cognitive functions and awareness: Secondary | ICD-10-CM

## 2021-04-10 DIAGNOSIS — G629 Polyneuropathy, unspecified: Secondary | ICD-10-CM | POA: Insufficient documentation

## 2021-04-10 HISTORY — DX: Unspecified dementia, unspecified severity, without behavioral disturbance, psychotic disturbance, mood disturbance, and anxiety: F03.90

## 2021-04-10 NOTE — Progress Notes (Signed)
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 was variable, ranging from the exceptionally low to average normative ranges. Basic attention was average to well above average. More complex attention (e.g., working memory) was variable, ranging from the well below average to average normative ranges. Executive functioning was variable but largely exceptionally low. She performed in the average range on a task assessing reasoning abilities. She also performed in the well above average range on a task assessing safety and judgment.  Assessed receptive language abilities were average. Likewise, Ms. Sailer generally did not exhibit difficulties comprehending task instructions and answered all questions asked of her appropriately. Assessed expressive language was mildly variable. Phonemic fluency was below average to average, semantic fluency was well below average to below average, and confrontation naming was average to above average.     Assessed visuospatial/visuoconstructional abilities were exceptionally low to below average. Points were lost on  her drawing of a clock due to spatial abnormalities in numerical placement, as well as there being no representation of two distinct clock hands. Points were generally lost on her copy of a complex figure due to very poor attention to detail (she omitted two internal aspects entirely). Spatial properties were largely adequate.    Learning (i.e., encoding) of novel verbal information was below average across a list of words and above average across a story. Spontaneous delayed recall (i.e., retrieval) of previously learned information was exceptionally low to well below average. Retention rates were 30% across a story learning task, 0% across a list learning task, and 7% across a figure drawing task. Performance across recognition tasks was below average to average, suggesting evidence for information consolidation.   Results of emotional screening instruments suggested that recent symptoms of generalized anxiety were in the mild range, while symptoms of depression were within normal limits. A screening instrument assessing recent sleep quality suggested the presence of moderate sleep dysfunction.  Tables of Scores:   Note: This summary of test scores accompanies the interpretive report and should not be considered in isolation without reference to the appropriate sections in the text. Descriptors are based on appropriate normative data and may be adjusted based on clinical judgment. Terms such as "Within Normal Limits" and "Outside Normal Limits" are used when a more specific description of the test score cannot be determined.       Percentile - Normative Descriptor > 98 - Exceptionally High 91-97 - Well Above Average 75-90 - Above Average 25-74 - Average 9-24 - Below Average 2-8 - Well Below Average < 2 - Exceptionally Low       Validity:   DESCRIPTOR       Dot Counting Test: --- --- Within Normal Limits  RBANS Effort Index: --- --- Within Normal Limits  WAIS-IV Reliable Digit Span: --- ---  Within Normal Limits       Orientation:  Raw Score Percentile   NAB Orientation, Form 1 26/29 --- ---       Cognitive Screening:      Raw Score Percentile   SLUMS: 21/30 --- ---       RBANS, Form A: Standard Score/ Scaled Score Percentile   Total Score 79 8 Well Below Average  Immediate Memory 97 42 Average    List Learning 7 16 Below Average    Story Memory 12 75 Above Average  Visuospatial/Constructional 72 3 Well Below Average    Figure Copy 4 2 Well Below Average    Line Orientation 13/20 10-16 Below Average  Language 88 21 Below Average    Picture Naming 9/10 26-50 Average    Semantic Fluency 5 5 Well Below Average  Attention 79 8 Well Below Average    Digit Span 10 50 Average    Coding 3 1 Exceptionally Low  Delayed Memory 79 8 Well Below Average    List Recall 0/10 <2 Exceptionally Low    List Recognition 20/20 51-75 Average    Story Recall 4 2 Well Below Average    Story Recognition 8/12 8-15 Below Average    Figure Recall 2 <1 Exceptionally Low    Figure Recognition 5/8 21-29 Below Average             Intellectual Functioning:      Standard Score Percentile   Test of Premorbid Functioning: 94 34 Average       Attention/Executive Function:     Trail Making Test (TMT): Raw Score (T Score) Percentile     Part A 84 secs.,  1 error (23) <1 Exceptionally Low    Part B Discontinued --- Impaired         Scaled Score Percentile   WAIS-IV Digit Span: 10 50 Average    Forward 15 95 Well Above Average    Backward 5 5 Well Below Average    Sequencing 10 50 Average        Scaled Score Percentile   WAIS-IV Similarities: 10 50 Average       D-KEFS Color-Word Interference Test: Raw Score (Scaled Score) Percentile     Color Naming 42 secs. (7) 16 Below Average    Word Reading 28 secs. (9) 37 Average    Inhibition 102 secs. (5) 5 Well Below Average      Total Errors 17 errors (1) <1 Exceptionally Low    Inhibition/Switching Discontinued --- Impaired      Total  Errors --- --- ---       D-KEFS Verbal Fluency Test: Raw Score (Scaled Score) Percentile     Letter Total Correct 32 (9) 37 Average    Category Total Correct 22 (6) 9 Below Average    Category Switching Total Correct 4 (1) <1 Exceptionally Low    Category Switching Accuracy 3 (2) <1 Exceptionally Low      Total Set Loss Errors 0 (13) 84 Above Average      Total Repetition Errors 3 (10) 50 Average       NAB Executive Functions Module, Form 1: T Score Percentile     Judgment 63 91 Well Above Average       Language:     Verbal Fluency Test: Raw Score (T Score) Percentile     Phonemic Fluency (FAS) 32 (42) 21 Below Average    Animal Fluency 12 (36) 8 Well Below Average        NAB Language Module, Form 1: T Score Percentile  Auditory Comprehension 56 73 Average    Naming 31/31 (60) 84 Above Average       Visuospatial/Visuoconstruction:      Raw Score Percentile   Clock Drawing: 6/10 --- Impaired       NAB Spatial Module, Form 1: T Score Percentile     Visual Discrimination 35 7 Well Below Average        Scaled Score Percentile   WAIS-IV Block Design: 6 9 Below Average       Mood and Personality:      Raw Score Percentile   Geriatric Depression Scale: 9 --- Within Normal Limits  Geriatric Anxiety Scale: 15 --- Mild    Somatic 9 --- Mild    Cognitive 3 --- Mild    Affective 3 --- Minimal       Additional Questionnaires:      Raw Score Percentile   PROMIS Sleep Disturbance Questionnaire: 30 --- Moderate   Informed Consent and Coding/Compliance:   The current evaluation represents a clinical evaluation for the purposes previously outlined by the referral source and is in no way reflective of a forensic evaluation.   Ms. Beattie was provided with a verbal description of the nature and purpose of the present neuropsychological evaluation. Also reviewed were the foreseeable risks and/or discomforts and benefits of the procedure, limits of confidentiality, and mandatory  reporting requirements of this provider. The patient was given the opportunity to ask questions and receive answers about the evaluation. Oral consent to participate was provided by the patient.   This evaluation was conducted by Newman Nickels, Ph.D., ABPP-CN, board certified clinical neuropsychologist. Ms. Adler completed a clinical interview with Dr. Milbert Coulter, billed as one unit [phone], and 135 minutes of cognitive testing and scoring, billed as one unit [phone] and four additional units [id number]. Psychometrist Wallace Keller, B.S., assisted Dr. Milbert Coulter with test administration and scoring procedures. As a separate and discrete service, Dr. Milbert Coulter spent a total of 220 minutes in interpretation and report writing billed as one unit [phone] and three units 96133.

## 2021-04-10 NOTE — Progress Notes (Signed)
° °  Psychometrician Note   Cognitive testing was administered to Gina Lozano by Wallace Keller, B.S. (psychometrist) under the supervision of Dr. Newman Nickels, Ph.D., licensed psychologist on 04/10/21. [name] did not appear overtly distressed by the testing session per behavioral observation or responses across self-report questionnaires. Rest breaks were offered.    The battery of tests administered was selected by Dr. Newman Nickels, Ph.D. with consideration to [name] current level of functioning, the nature of her symptoms, emotional and behavioral responses during interview, level of literacy, observed level of motivation/effort, and the nature of the referral question. This battery was communicated to the psychometrist. Communication between Dr. Newman Nickels, Ph.D. and the psychometrist was ongoing throughout the evaluation and Dr. Newman Nickels, Ph.D. was immediately accessible at all times. Dr. Newman Nickels, Ph.D. provided supervision to the psychometrist on the date of this service to the extent necessary to assure the quality of all services provided.    [name] will return within approximately 1-2 weeks for an interactive feedback session with Dr. Milbert Coulter at which time her test performances, clinical impressions, and treatment recommendations will be reviewed in detail. [name] understands she can contact our office should she require our assistance before this time.  A total of 135 minutes of billable time were spent face-to-face with Ms. Colon by the psychometrist. This includes both test administration and scoring time. Billing for these services is reflected in the clinical report generated by Dr. Newman Nickels, Ph.D.  This note reflects time spent with the psychometrician and does not include test scores or any clinical interpretations made by Dr. Milbert Coulter. The full report will follow in a separate note.

## 2021-04-11 ENCOUNTER — Encounter: Payer: Self-pay | Admitting: Psychology

## 2021-04-11 ENCOUNTER — Telehealth: Payer: Self-pay | Admitting: Neurology

## 2021-04-11 NOTE — Telephone Encounter (Signed)
I called pts daughter, Anderson Malta, she is not able to connect with mychart, she got new phone.  I deactivated old and restarted new and sent her email.  I will forward to her the information per Dr. Jaynee Eagles and Anderson Malta is to look at medications, I do not see that pt is on any on the list porvided.    Will proceed with the DATscan and is hoping that the insurance will pay for this.

## 2021-04-11 NOTE — Telephone Encounter (Signed)
Gina Lozano: Patient underwent formal neuropsychiatric testing today.  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  In that case an FDG PET scan may not be the best test, we may want to order something called a DAT scan which is a test we use for parkinsonian disorder such as Lewy body dementia or Parkinson's disease.  Also, insurance may approve this and they may not have to pay out-of-pocket.  Please call and discuss with family and see if they would like to go forward with the DaTscan and might give [place] more answers as to the etiology of her tremors and her dementia.  Below is a description, I can send this through [place], the important thing to review with patient is the list of medications that she will have to stop prior to DAT scan, see below and review these with the patient and see if she has any of these on her list she may have to stop them for a few days prior to DaTscan if they are willing to go forward with that thanks let me know and I will order it. Appreciate my colleague Dr. Melvyn Novas.   Brain DaTscan How to prepare and what to expect What is a brain DaTscan? A brain DaTscan is a nuclear medicine scan. It uses radioactive material to diagnose some diseases of the brain, especially those that cause tremor (shakiness). DaTscan is a brand name for a drug called ioflupane I-123. A brain DaTscan is a form of radiology, because radiation is used to take pictures of the body. This radioactive drug is ordered especially for you. Because of this, we need at least 72 hours notice if you must cancel or reschedule your scan.   How does the scan work? You will be given a small dose of tracer (radioactive material) through an intravenous (IV) line. This tracer will collect in part of your brain and give off gamma rays. A special camera called a gamma camera will use these rays to produce pictures and measurements of your brain. How do I  prepare? Some drugs will affect the results of your brain DaTscan. You will need to stop taking these drugs before your scan. The table on page 2 lists the drugs that need to be stopped, and for how many days before your scan. This list is in alphabetical order by the generic name of the drug. The common brand names are listed beneath the generic name. Please confirm these instructions with your doctor who prescribed the drug. Drugs to Stop Taking Before your scan, stop taking these medicines for the length of time shown: Name of Drug Stop Taking  Amoxapine 4 days before  Benztropine  Cogentin 3 days before  Bupropion (Aplenzin, Budeprion, Voxra, Wellbutrin, Zyban) 48 hours before  Buspirone 15 hours before  Citalopram 24 hours before  Cocaine 6 hours before  Escitalopram 24 hours before  Methamphetamine 24 hours before  Methylphenidate (Concerta, Metadate, Methylin, Ritalin) 20 hours before  Paroxetine 24 hours before  Selegilene 48 hours before  Sertraline 3 days before  If you are breastfeeding, or if there is any chance you are pregnant, please tell the scheduler or technologist (the person who will help you prepare for your scan). How is the scan done? When you first arrive, we will ask you to drink a small cup of water with potassium iodine in it. This water may have a metallic taste.  An hour after you drink the potassium iodine  water, the technologist will inject a small amount of tracer into a vein in your arm or hand through your IV.  You must stay in the department for 30 minutes after the injection.  You will then have a break for 3 hours. It is OK to eat and drink during this break.  You must return to the clinic after this 3-hour break to have images of your brain taken.  Then, 4 hours after you receive your tracer injection, the technologist will take images of your brain with the gamma camera. You will lie flat on the exam table while these images are being taken.  You  must not move while the camera is taking pictures. If you move, the pictures will be blurry and may have to be taken again.  Taking the images will take 40 to 45 minutes. Your total time in the imaging room will be about 1 hour.  You may also have a low-dose CT scan of your brain to help confirm any results. A CT scan is another way to take images inside your body.  It will take about 5 hours from the time you drink the potassium iodine water until the scans are complete. What will I feel during the scan? The technologist will help make you as comfortable as possible on the exam table for the scan.  You may feel some minor discomfort from the IV.  Lying still on the exam table may be hard for some patients.  The camera will be close to your head. This may make you feel confined or uneasy (claustrophobic). Please tell the doctor who referred you for this scan if you know you are claustrophobic. Are there any side effects from the scan? Most of the radioactivity from the tracer will pass out of your body in your urine or stool. The rest simply goes away over time.  Bad reactions to this scan are very rare. Fewer than 1% of patients (fewer than 1 out of 100) have a bad reaction. Reactions may include headache, nausea, vertigo (dizziness), or dry mouth. How do I get the results? When the test is over, the nuclear medicine doctor will review your images, prepare a written report, and talk with your doctor about the results. Your doctor will then talk with you about the results and your treatment options. If you needed to stop taking any medicines on the day of your scan, ask your doctor when to start taking them again.  The potentially interfering drugs consist of: amoxapine, amphetamine, benztropine, bupropion, buspirone, citalopram, cocaine, mazindol, methamphetamine, methylphenidate, norephedrine, phentermine, escitalopram,  phenylpropanolamine, selegiline, paroxetine, and sertraline

## 2021-04-16 NOTE — Addendum Note (Signed)
Addended by: Naomie Dean B on: 04/16/2021 05:20 PM   Modules accepted: Orders

## 2021-04-17 DIAGNOSIS — R69 Illness, unspecified: Secondary | ICD-10-CM | POA: Diagnosis not present

## 2021-04-17 DIAGNOSIS — F419 Anxiety disorder, unspecified: Secondary | ICD-10-CM | POA: Diagnosis not present

## 2021-04-17 DIAGNOSIS — F32A Depression, unspecified: Secondary | ICD-10-CM | POA: Diagnosis not present

## 2021-04-18 ENCOUNTER — Telehealth: Payer: Self-pay | Admitting: Neurology

## 2021-04-18 NOTE — Telephone Encounter (Signed)
[employer] no auth spoke to [place] A ref # [id number]. Sent to Nuclear Medicine ph #  [phone].  They will reach out to the patient to schedule.

## 2021-04-19 ENCOUNTER — Encounter: Payer: Self-pay | Admitting: Neurology

## 2021-04-19 ENCOUNTER — Ambulatory Visit (INDEPENDENT_AMBULATORY_CARE_PROVIDER_SITE_OTHER): Payer: Medicare HMO | Admitting: Psychology

## 2021-04-19 ENCOUNTER — Other Ambulatory Visit: Payer: Self-pay

## 2021-04-19 DIAGNOSIS — R69 Illness, unspecified: Secondary | ICD-10-CM | POA: Diagnosis not present

## 2021-04-19 DIAGNOSIS — F4323 Adjustment disorder with mixed anxiety and depressed mood: Secondary | ICD-10-CM | POA: Diagnosis not present

## 2021-04-19 DIAGNOSIS — F0390 Unspecified dementia without behavioral disturbance: Secondary | ICD-10-CM | POA: Diagnosis not present

## 2021-04-19 DIAGNOSIS — I6381 Other cerebral infarction due to occlusion or stenosis of small artery: Secondary | ICD-10-CM | POA: Diagnosis not present

## 2021-04-19 NOTE — Progress Notes (Signed)
° °  Neuropsychology Feedback Session [name]. [facility] Department of Neurology  Reason for Referral:   Gina Lozano is a 81 y.o. right-handed Caucasian female referred by  Naomie Dean, M.D. , to characterize her current cognitive functioning and assist with diagnostic clarity and treatment planning in the context of a prior diagnosis of a "mild dementia," progressive memory loss, and questions regarding the underlying cause(s) of cognitive decline.   Feedback:   [name] completed a comprehensive neuropsychological evaluation on 04/10/2021. Please refer to that encounter for the full report and recommendations. Briefly, results suggested primary impairments surrounding executive functioning, visuospatial abilities, and retrieval aspects of both verbal and visual memory. Further variability was exhibited across processing speed, complex attention, and semantic fluency. The etiology of her underlying dysfunction is difficult to discern at the present time. I believe that consideration should be given to a Lewy body dementia presentation. Her pattern across testing suggesting greatest difficulty surrounding executive functioning and visuospatial abilities is expected with this condition. Additional variability/weakness surrounding processing speed and complex attention are further consistent with expected patterns of performance. Behavioral changes are common with Lewy body dementia, as are tremors and other parkinsonian features. There appears to be a lack of clarity surrounding if [name] has experienced fully-formed visual hallucinations in the past. She did not report any REM sleep behaviors. While these symptoms are common in Lewy body dementia, their absence certainly would not rule out this condition. I also cannot rule out Alzheimer's disease at the present time. However, current patterns across testing do not align with typical presentations. It is worth  stating that Alzheimer's disease and Lewy body disease commonly co-occur and can create a mixed dementia presentation. Continued medical monitoring will be important moving forward.   [name] was accompanied by her son during the current feedback session. Content of the current session focused on the results of her neuropsychological evaluation. [name] was given the opportunity to ask questions and her questions were answered. She was encouraged to reach out should additional questions arise. A copy of her report was provided at the conclusion of the visit.      30 minutes were spent conducting the current feedback session with [name], billed as one unit [id number].

## 2021-04-24 DIAGNOSIS — J45909 Unspecified asthma, uncomplicated: Secondary | ICD-10-CM | POA: Diagnosis not present

## 2021-04-24 DIAGNOSIS — R69 Illness, unspecified: Secondary | ICD-10-CM | POA: Diagnosis not present

## 2021-04-24 DIAGNOSIS — E1159 Type 2 diabetes mellitus with other circulatory complications: Secondary | ICD-10-CM | POA: Diagnosis not present

## 2021-04-24 DIAGNOSIS — R569 Unspecified convulsions: Secondary | ICD-10-CM | POA: Diagnosis not present

## 2021-04-24 DIAGNOSIS — E7800 Pure hypercholesterolemia, unspecified: Secondary | ICD-10-CM | POA: Diagnosis not present

## 2021-04-24 DIAGNOSIS — K219 Gastro-esophageal reflux disease without esophagitis: Secondary | ICD-10-CM | POA: Diagnosis not present

## 2021-04-24 DIAGNOSIS — I2510 Atherosclerotic heart disease of native coronary artery without angina pectoris: Secondary | ICD-10-CM | POA: Diagnosis not present

## 2021-04-24 DIAGNOSIS — G20 Parkinson's disease: Secondary | ICD-10-CM | POA: Diagnosis not present

## 2021-04-24 DIAGNOSIS — G3183 Dementia with Lewy bodies: Secondary | ICD-10-CM | POA: Diagnosis not present

## 2021-04-30 DIAGNOSIS — R69 Illness, unspecified: Secondary | ICD-10-CM | POA: Diagnosis not present

## 2021-04-30 DIAGNOSIS — F32A Depression, unspecified: Secondary | ICD-10-CM | POA: Diagnosis not present

## 2021-04-30 DIAGNOSIS — F419 Anxiety disorder, unspecified: Secondary | ICD-10-CM | POA: Diagnosis not present

## 2021-05-01 DIAGNOSIS — I2510 Atherosclerotic heart disease of native coronary artery without angina pectoris: Secondary | ICD-10-CM | POA: Diagnosis not present

## 2021-05-01 DIAGNOSIS — G3183 Dementia with Lewy bodies: Secondary | ICD-10-CM | POA: Diagnosis not present

## 2021-05-01 DIAGNOSIS — E7800 Pure hypercholesterolemia, unspecified: Secondary | ICD-10-CM | POA: Diagnosis not present

## 2021-05-01 DIAGNOSIS — K219 Gastro-esophageal reflux disease without esophagitis: Secondary | ICD-10-CM | POA: Diagnosis not present

## 2021-05-01 DIAGNOSIS — R569 Unspecified convulsions: Secondary | ICD-10-CM | POA: Diagnosis not present

## 2021-05-01 DIAGNOSIS — E1159 Type 2 diabetes mellitus with other circulatory complications: Secondary | ICD-10-CM | POA: Diagnosis not present

## 2021-05-01 DIAGNOSIS — G20 Parkinson's disease: Secondary | ICD-10-CM | POA: Diagnosis not present

## 2021-05-01 DIAGNOSIS — R69 Illness, unspecified: Secondary | ICD-10-CM | POA: Diagnosis not present

## 2021-05-01 DIAGNOSIS — I10 Essential (primary) hypertension: Secondary | ICD-10-CM | POA: Diagnosis not present

## 2021-05-01 DIAGNOSIS — J45909 Unspecified asthma, uncomplicated: Secondary | ICD-10-CM | POA: Diagnosis not present

## 2021-05-03 DIAGNOSIS — E785 Hyperlipidemia, unspecified: Secondary | ICD-10-CM | POA: Diagnosis not present

## 2021-05-03 DIAGNOSIS — G300 Alzheimer's disease with early onset: Secondary | ICD-10-CM | POA: Diagnosis not present

## 2021-05-03 DIAGNOSIS — E119 Type 2 diabetes mellitus without complications: Secondary | ICD-10-CM | POA: Diagnosis not present

## 2021-05-03 DIAGNOSIS — I10 Essential (primary) hypertension: Secondary | ICD-10-CM | POA: Diagnosis not present

## 2021-05-03 DIAGNOSIS — D518 Other vitamin B12 deficiency anemias: Secondary | ICD-10-CM | POA: Diagnosis not present

## 2021-05-03 DIAGNOSIS — E038 Other specified hypothyroidism: Secondary | ICD-10-CM | POA: Diagnosis not present

## 2021-05-03 DIAGNOSIS — E1159 Type 2 diabetes mellitus with other circulatory complications: Secondary | ICD-10-CM | POA: Diagnosis not present

## 2021-05-03 DIAGNOSIS — E559 Vitamin D deficiency, unspecified: Secondary | ICD-10-CM | POA: Diagnosis not present

## 2021-05-03 DIAGNOSIS — E11649 Type 2 diabetes mellitus with hypoglycemia without coma: Secondary | ICD-10-CM | POA: Diagnosis not present

## 2021-05-08 ENCOUNTER — Emergency Department (HOSPITAL_COMMUNITY): Payer: Medicare HMO

## 2021-05-08 ENCOUNTER — Other Ambulatory Visit: Payer: Self-pay

## 2021-05-08 ENCOUNTER — Emergency Department (HOSPITAL_COMMUNITY)
Admission: EM | Admit: 2021-05-08 | Discharge: 2021-05-08 | Disposition: A | Discharge status: Home / Self Care | Payer: Medicare HMO | Attending: Emergency Medicine | Admitting: Emergency Medicine

## 2021-05-08 ENCOUNTER — Emergency Department (HOSPITAL_COMMUNITY)
Admission: RE | Admit: 2021-05-08 | Discharge: 2021-05-08 | Disposition: A | Discharge status: Home / Self Care | Payer: Medicare HMO | Source: Ambulatory Visit | Attending: Neurology | Admitting: Neurology

## 2021-05-08 DIAGNOSIS — M25511 Pain in right shoulder: Secondary | ICD-10-CM | POA: Diagnosis not present

## 2021-05-08 DIAGNOSIS — S42301A Unspecified fracture of shaft of humerus, right arm, initial encounter for closed fracture: Secondary | ICD-10-CM | POA: Diagnosis not present

## 2021-05-08 DIAGNOSIS — W010XXA Fall on same level from slipping, tripping and stumbling without subsequent striking against object, initial encounter: Secondary | ICD-10-CM | POA: Diagnosis not present

## 2021-05-08 DIAGNOSIS — R251 Tremor, unspecified: Secondary | ICD-10-CM

## 2021-05-08 DIAGNOSIS — S42291A Other displaced fracture of upper end of right humerus, initial encounter for closed fracture: Secondary | ICD-10-CM | POA: Insufficient documentation

## 2021-05-08 DIAGNOSIS — G20 Parkinson's disease: Secondary | ICD-10-CM

## 2021-05-08 DIAGNOSIS — M25531 Pain in right wrist: Secondary | ICD-10-CM | POA: Diagnosis not present

## 2021-05-08 DIAGNOSIS — Z7982 Long term (current) use of aspirin: Secondary | ICD-10-CM | POA: Diagnosis not present

## 2021-05-08 DIAGNOSIS — S4991XA Unspecified injury of right shoulder and upper arm, initial encounter: Secondary | ICD-10-CM | POA: Diagnosis not present

## 2021-05-08 IMAGING — NM NM DATSCAN
4 series · 24 of 24 positions shown · non-contrast
Comparison: None.

CLINICAL DATA: 80-year-old female with tremors which initiated
after cerebrovascular accident. Tremors in hands.

EXAM:
NUCLEAR MEDICINE BRAIN IMAGING WITH SPECT  (DaTscan )
TECHNIQUE: SPECT images of the brain were obtained after intravenous injection
of radiopharmaceutical. 4 hour post injection imaging. Appropriate
positioning.
130 mg KHETAN STAT given orally for thyroid blockade.
RADIOPHARMACEUTICALS:  4.6 millicuries I 123 Ioflupane

[Series 1: dat scan · 4.14mm/px · 6 of 120 frames shown]
[frame 11/120  full-range]
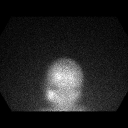
[frame 31/120  full-range]
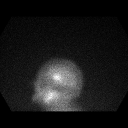
[frame 51/120  full-range]
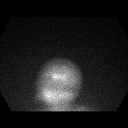
[frame 71/120  full-range]
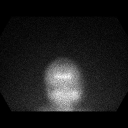
[frame 91/120  full-range]
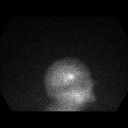
[frame 111/120  full-range]
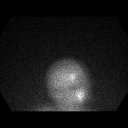

[Series 1: spect - 159 kev _(id)_cor · 4.1mm · 4.14mm/px · 6 of 128 frames shown]
[frame 11/128]
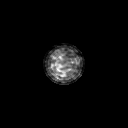
[frame 32/128]
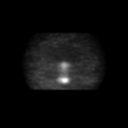
[frame 54/128]
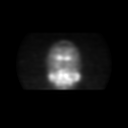
[frame 75/128]
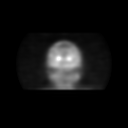
[frame 96/128]
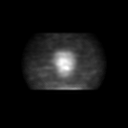
[frame 118/128]
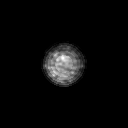

[Series 1: spect - 159 kev _(id)_tra · 4.1mm · 4.14mm/px · 6 of 128 frames shown]
[frame 11/128]
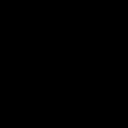
[frame 32/128]
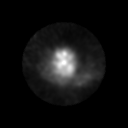
[frame 54/128]
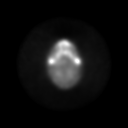
[frame 75/128]
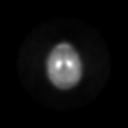
[frame 96/128]
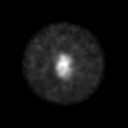
[frame 118/128]
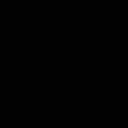

[Series 7: datquant results · 4.1mm · 4.14mm/px · 6 of 128 frames shown]
[frame 11/128]
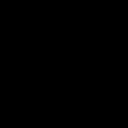
[frame 32/128]
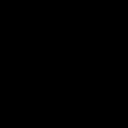
[frame 54/128]
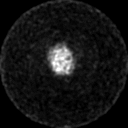
[frame 75/128]
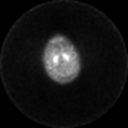
[frame 96/128]
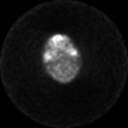
[frame 118/128]
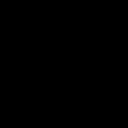

[24 of 24 positions shown; findings below may reference images not displayed]

FINDINGS: Symmetric intense uptake within LEFT and RIGHT striata. The heads of
the caudate nuclei and the posterior striata (putamen) are normal
shape. No evidence of loss of dopamine transport populations in the
basal ganglia.
IMPRESSION: Normal Ioflupane scan. No reduced radiotracer activity in basal
ganglia to suggest Parkinson's syndrome pathology.

Of note, DaTSCAN is not diagnostic of Parkinsonian syndromes, which
remains a clinical diagnosis. DaTscan is an adjuvant test to aid in
the clinical diagnosis of Parkinsonian syndromes.

## 2021-05-08 IMAGING — CR DG WRIST COMPLETE 3+V*R*
4 series · 4 of 4 positions shown · non-contrast
Comparison: None.

CLINICAL DATA: Right wrist pain after fall.

EXAM:
RIGHT WRIST - COMPLETE 3+ VIEW

[wrist pa]
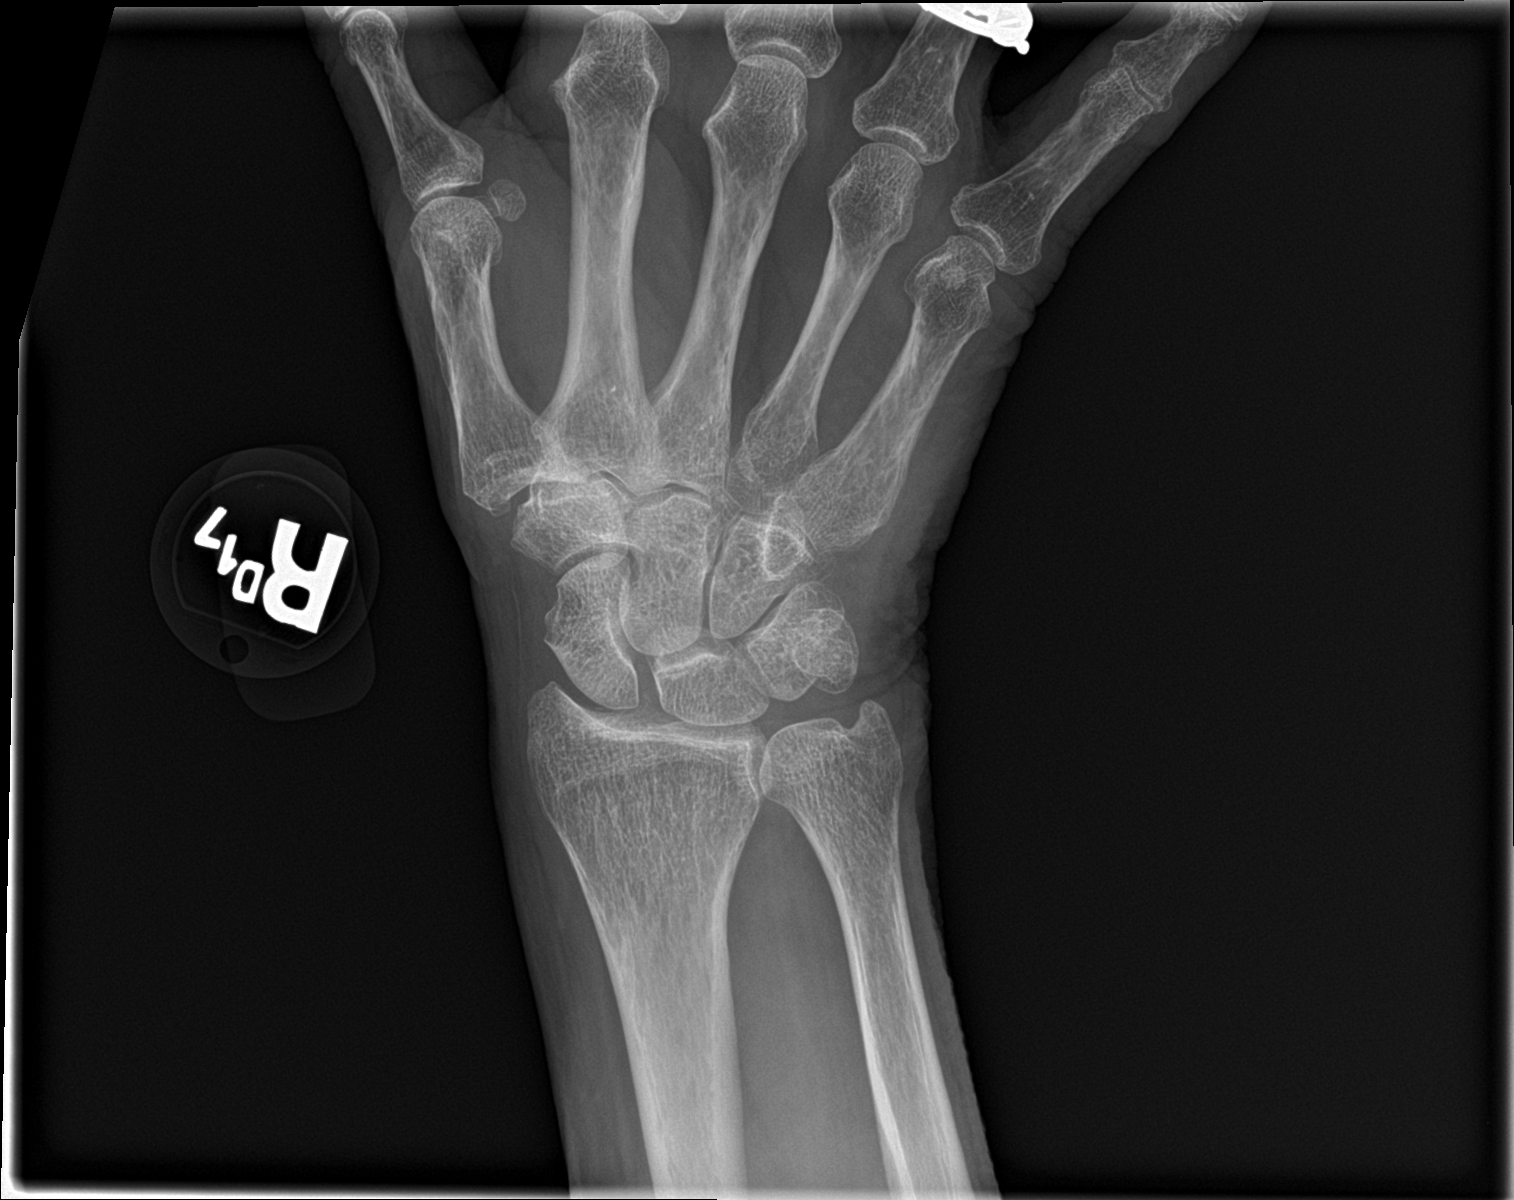

[wrist obl]
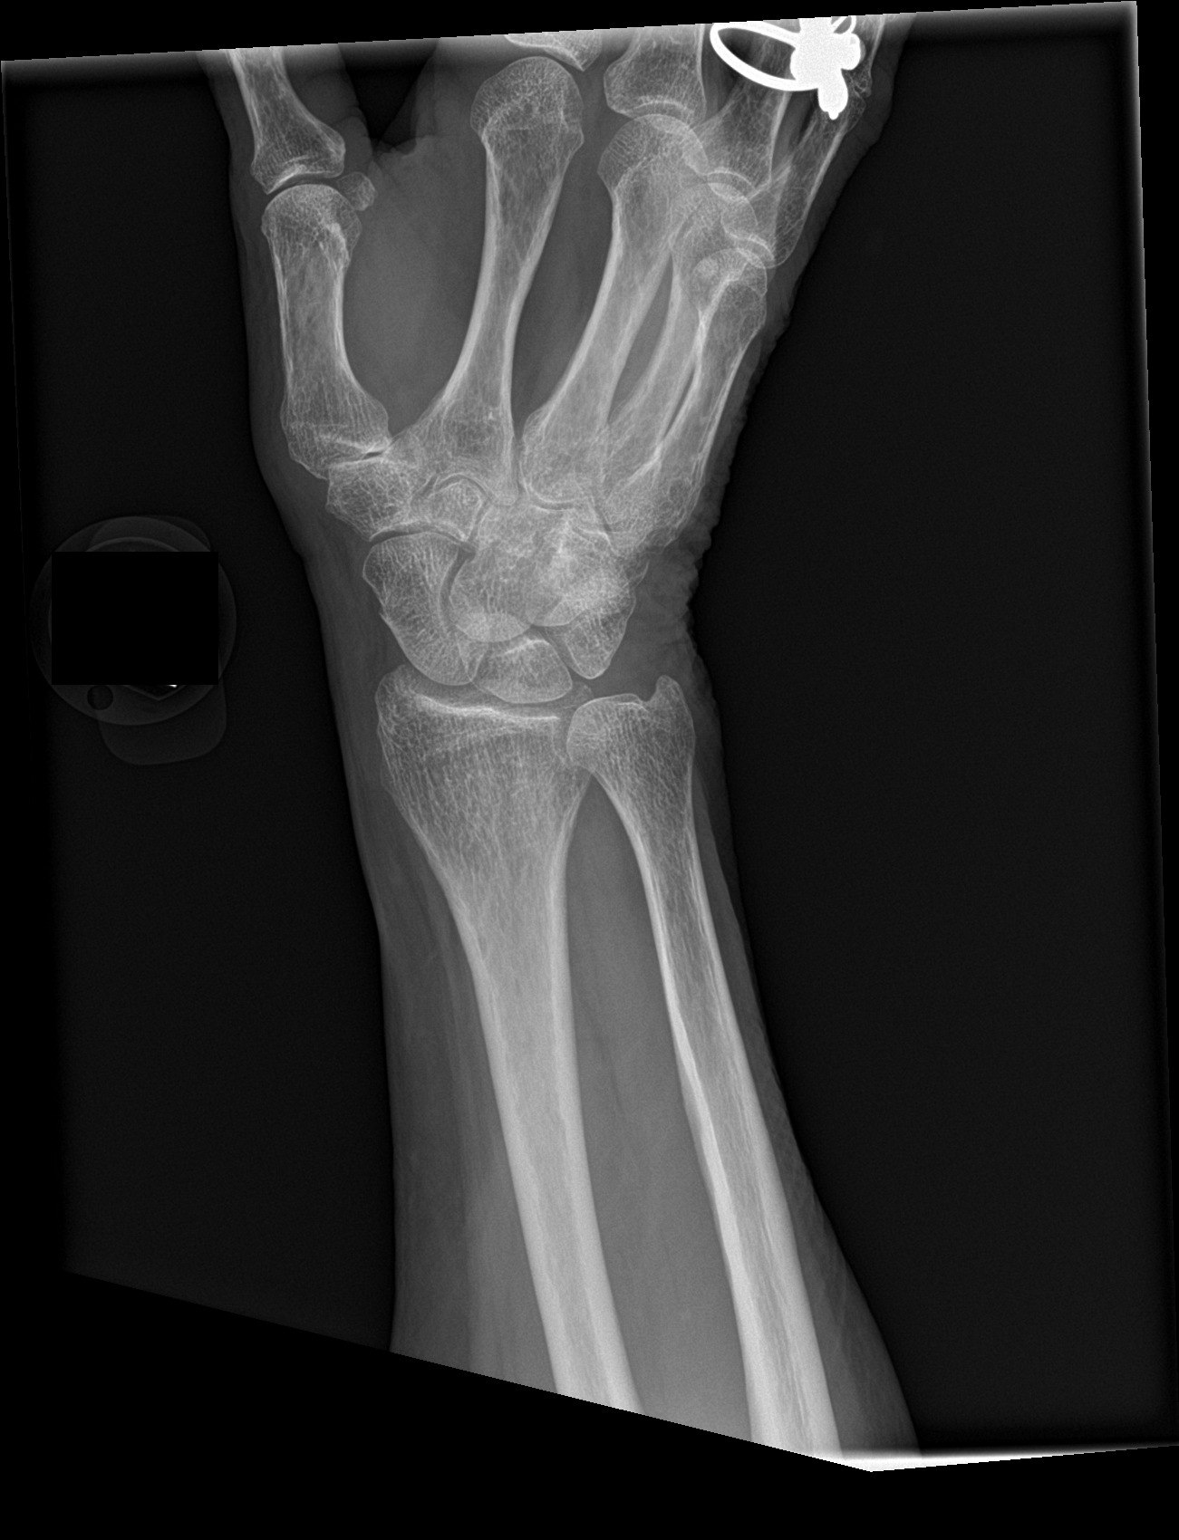

[wrist lat]
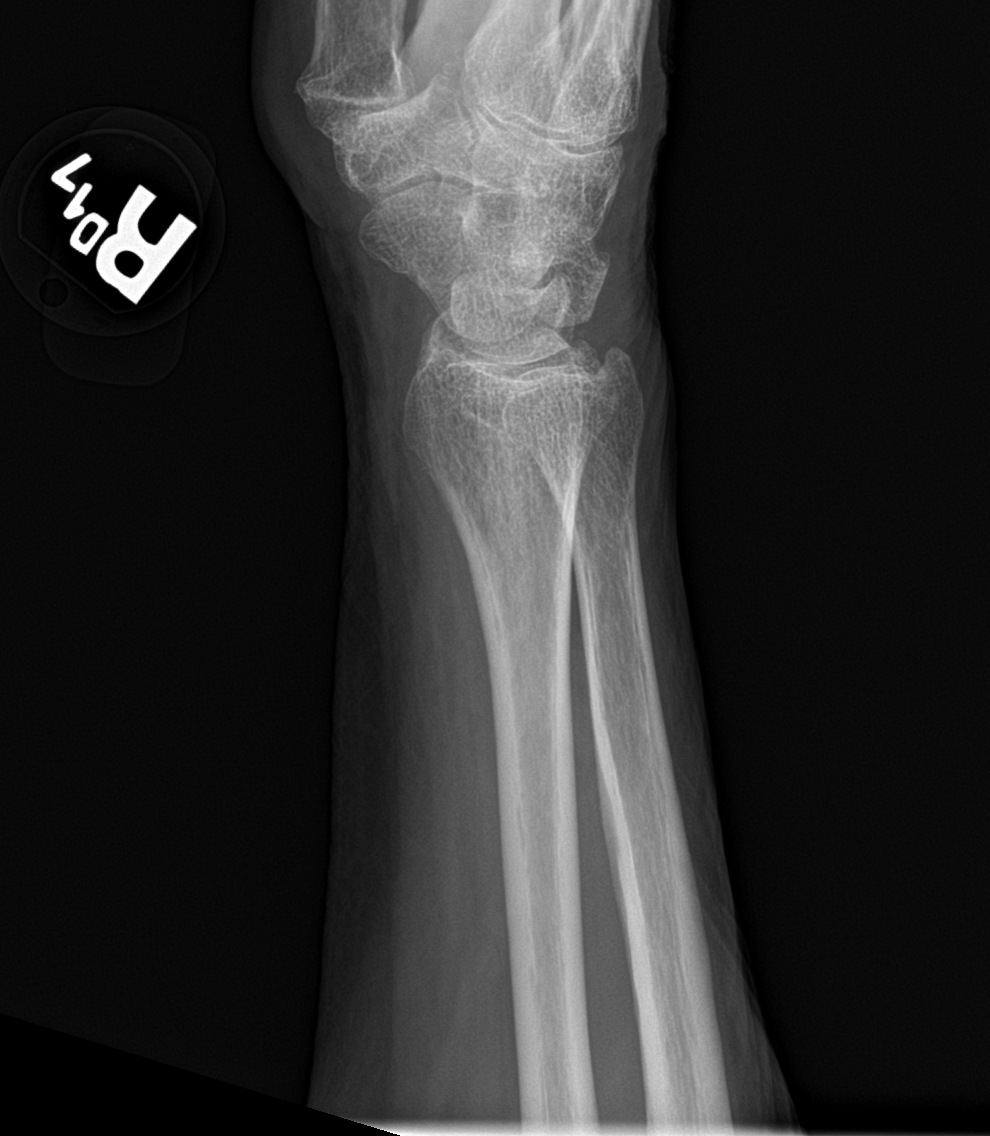

[wrist navicular]
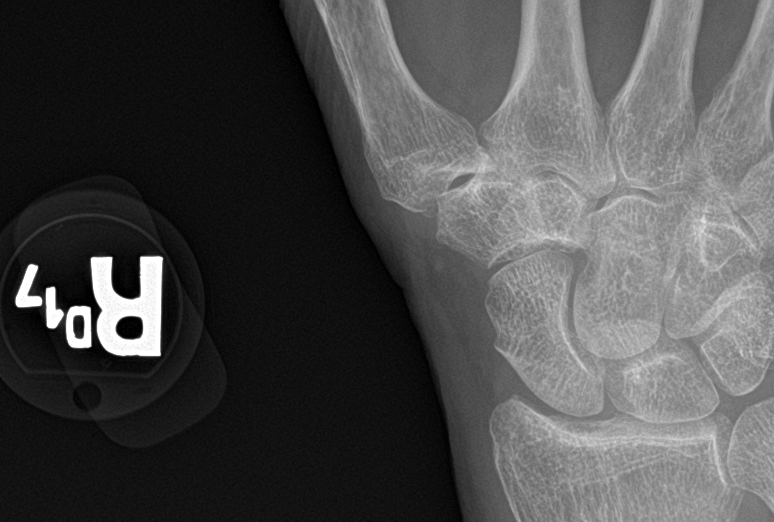

[4 of 4 positions shown; findings below may reference images not displayed]

FINDINGS: There is no evidence of fracture or dislocation. There is no
evidence of arthropathy or other focal bone abnormality. Soft
tissues are unremarkable.
IMPRESSION: Negative.

## 2021-05-08 IMAGING — CR DG SHOULDER 2+V*R*
3 series · 3 of 3 positions shown · non-contrast
Comparison: None.

CLINICAL DATA: Right shoulder pain after fall.

EXAM:
RIGHT SHOULDER - 2+ VIEW

[shoulder grashey]
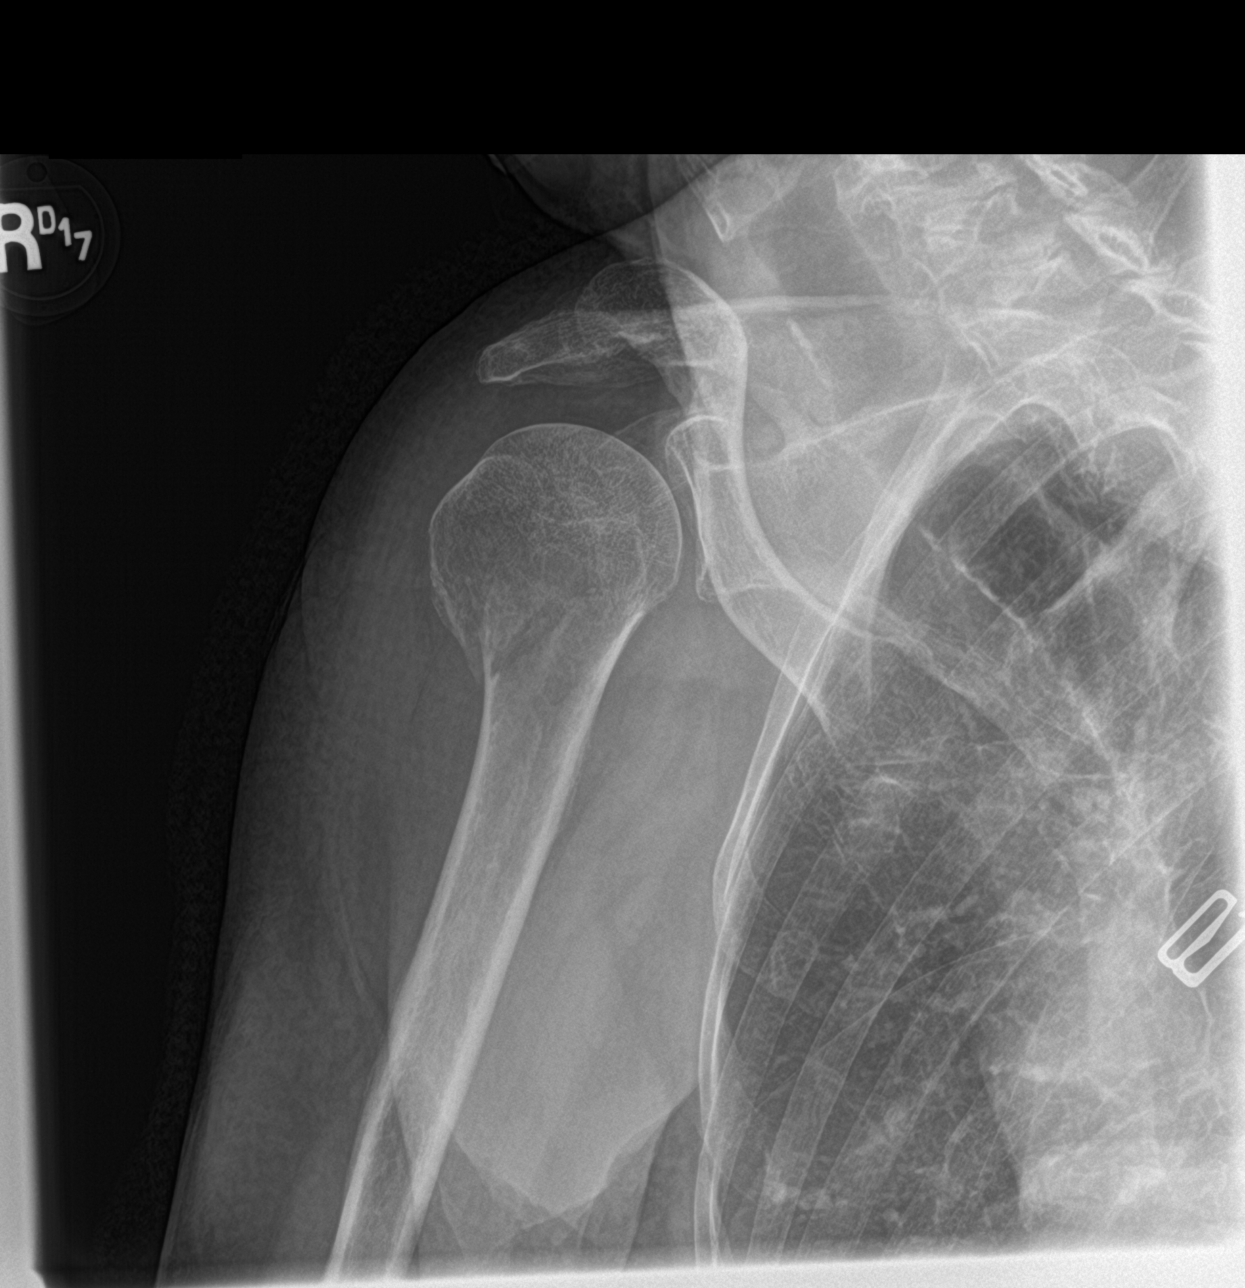

[shoulder y view]
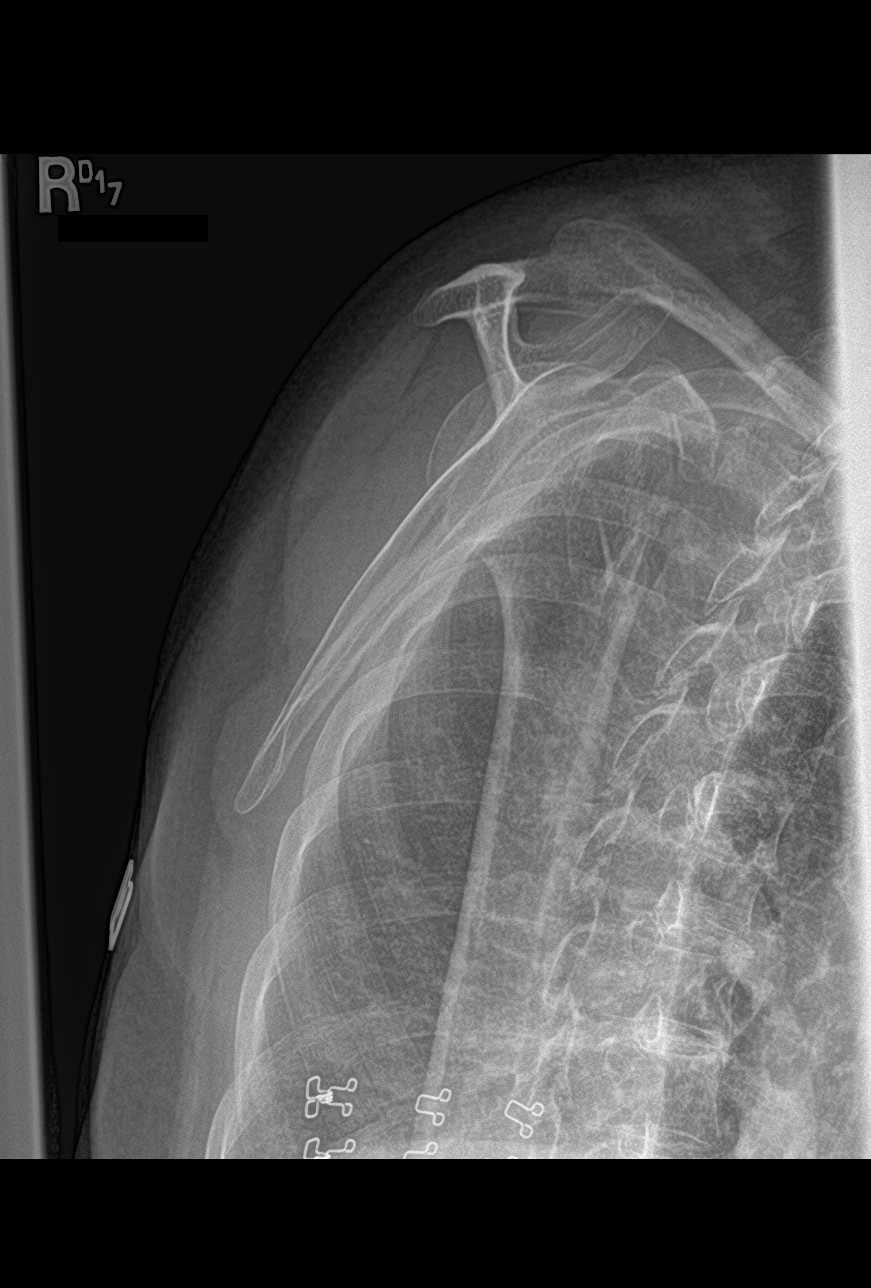

[shoulder ap neutral]
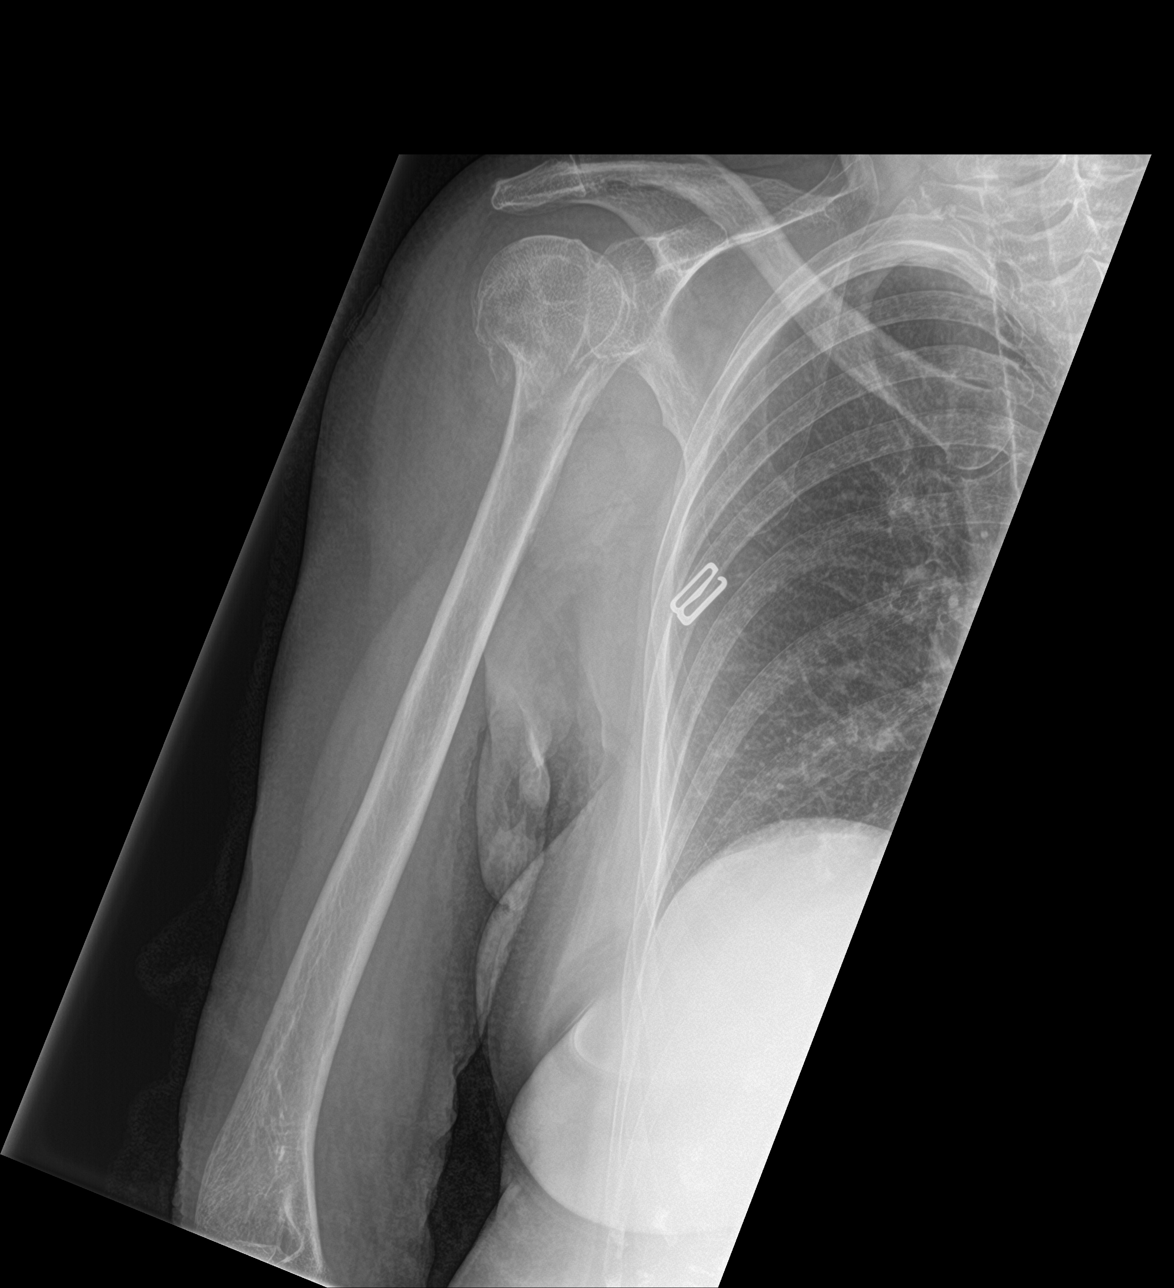

[3 of 3 positions shown; findings below may reference images not displayed]

FINDINGS: Mildly displaced proximal right humeral head fracture is noted. Mohammad Mujtaba
humeral joint is otherwise unremarkable.
IMPRESSION: Mildly displaced proximal right humeral head fracture.

## 2021-05-08 MED ORDER — POTASSIUM IODIDE (ANTIDOTE) 130 MG PO TABS
ORAL_TABLET | ORAL | Status: AC
  Filled 2021-05-08: qty 1

## 2021-05-08 MED ORDER — IOFLUPANE I 123 185 MBQ/2.5ML IV SOLN
4.6000 | Freq: Once | INTRAVENOUS | Status: AC | PRN
  Administered 2021-05-08: 4.6 via INTRAVENOUS
  Filled 2021-05-08: qty 5

## 2021-05-08 MED ORDER — OXYCODONE-ACETAMINOPHEN 5-325 MG PO TABS
1.0000 | ORAL_TABLET | Freq: Four times a day (QID) | ORAL | 0 refills | Status: DC | PRN
Start: 2021-05-08 — End: 2021-06-12

## 2021-05-08 MED ORDER — OXYCODONE-ACETAMINOPHEN 5-325 MG PO TABS
2.0000 | ORAL_TABLET | Freq: Once | ORAL | Status: AC
  Administered 2021-05-08: 2 via ORAL
  Filled 2021-05-08: qty 2

## 2021-05-08 MED ORDER — POTASSIUM IODIDE (ANTIDOTE) 130 MG PO TABS: 130.0000 mg | ORAL_TABLET | Freq: Once | ORAL | Status: DC

## 2021-05-08 NOTE — Discharge Instructions (Addendum)
You have a fracture of the humerus of your right arm.  This is a bone that connects your elbow to the shoulder.  He will need to wear the sling for comfort.  You will need to follow-up with an orthopedic doctor.  I have written a prescription for pain medicine called Norco.  This can cause you to feel drowsy and can increase the risk of you falling.  Please follow-up with an orthopedist.  I have also splinted your right wrist because you are tender in an area that sometimes does not show a fracture on x-ray.  You will need to have this re x-rayed when you follow-up with orthopedist.

## 2021-05-08 NOTE — ED Triage Notes (Signed)
Pt reports falling on a slick floor while at radiology appointment at this facility. Pt caught herself with R arm and has severe pain to R shoulder and R wrist. No LOC, did not hit head.

## 2021-05-08 NOTE — ED Provider Notes (Signed)
[facility]   CSN: [id number] Arrival date & time: 05/08/21  1102     History  Chief Complaint  Patient presents with   Marletta LorFall    Gina Lozano is a 81 y.o. female.   Fall  Gina Lozano is a 81 y.o. female who presented to the ED after she slipped and fell on an outstretched right hand while attending a radiology appointment in this facility within the past few hours. Her pain is localized most significantly to her right shoulder and her right wrist. During the fall she did not experience loss of consciousness or head trauma. She denies pain in the right hand, right elbow, or any other joint. She denies paresthesias and numbness in the right extremity.   No head injury, loss of consciousness, nausea, vomiting, lightheadedness or dizziness.    Home Medications Prior to Admission medications   Medication Sig Start Date End Date Taking? Authorizing Provider  oxyCODONE-acetaminophen (PERCOCET/ROXICET) 5-325 MG tablet Take 1 tablet by mouth every 6 (six) hours as needed for severe pain. 05/08/21  Yes Zavon Hyson, Stevphen MeuseWylder S, PA  acetaminophen (TYLENOL) 500 MG tablet Take 500 mg by mouth daily as needed.    [provider]  albuterol (PROVENTIL) (2.5 MG/3ML) 0.083% nebulizer solution Take by nebulization as needed. 07/17/20   [provider]  ALPRAZolam Prudy Feeler(XANAX) 0.25 MG tablet Take 0.25 mg by mouth 4 (four) times daily as needed. 11/08/20   [provider]  aspirin EC 81 MG tablet Take 81 mg by mouth daily. Swallow whole.    [provider]  Earlie ServerBREO ELLIPTA [phone] MCG/INH AEPB Inhale into the lungs. 09/05/20   [provider]  Carbidopa-Levodopa ER (SINEMET CR) 25-100 MG tablet controlled release Take 1 tablet by mouth 2 (two) times daily. 10/27/20   [provider]  cyclobenzaprine (FLEXERIL) 10 MG tablet Take 10 mg by mouth 3 (three) times daily as needed. 11/14/20   [provider]  fexofenadine (ALLEGRA) 180 MG tablet Take 180 mg by mouth daily. 11/07/20   [provider]  LEVEMIR FLEXTOUCH 100 UNIT/ML FlexTouch Pen Inject 15 Units into the skin at bedtime. 09/26/20   [provider]  lisinopril (ZESTRIL) 5 MG tablet Take 5 mg by mouth daily. 10/12/20   [provider]  meclizine (ANTIVERT) 25 MG tablet Take 25 mg by mouth daily as needed. 07/05/20   [provider]  meloxicam (MOBIC) 15 MG tablet Take 15 mg by mouth daily. 09/30/20   [provider]  metoprolol tartrate (LOPRESSOR) 25 MG tablet Take 25 mg by mouth daily. 10/12/20   [provider]  NOVOLOG FLEXPEN 100 UNIT/ML FlexPen Inject into the skin with breakfast, with lunch, and with evening meal. 8 units if CBG >150 10/17/20   [provider]  omeprazole (PRILOSEC) 20 MG capsule Take 20 mg by mouth daily. 09/30/20   [provider]  primidone (MYSOLINE) 50 MG tablet Take 50 mg by mouth 2 (two) times daily. 10/12/20   [provider]  simvastatin (ZOCOR) 20 MG tablet Take 20 mg by mouth every evening. 10/12/20   [provider]  SPIRIVA RESPIMAT 2.5 MCG/ACT AERS SMARTSIG:2 Puff(s) By Mouth Daily 09/05/20   [provider]      Allergies    Codeine, Pork-derived products, and Shellfish allergy    Review of Systems   Review of Systems  Physical Exam Updated Vital Signs BP (!) 154/76    Pulse 78  Temp 98.1 F (36.7 C) (Oral)    Resp 11    SpO2 98%  Physical Exam Vitals and nursing note reviewed.  Constitutional:      General: She is not in acute distress. HENT:     Head: Normocephalic and atraumatic.     Nose: Nose normal.  Eyes:     General: No scleral icterus. Cardiovascular:     Rate and Rhythm: Normal rate and regular rhythm.     Pulses: Normal pulses.     Heart sounds: Normal heart sounds.  Pulmonary:     Effort: Pulmonary effort is normal. No respiratory distress.     Breath sounds: No wheezing.   Abdominal:     Palpations: Abdomen is soft.     Tenderness: There is no abdominal tenderness.  Musculoskeletal:     Cervical back: Normal range of motion.     Right lower leg: No edema.     Left lower leg: No edema.     Comments: Tenderness palpation of right shoulder and right wrist there is some questionable snuffbox tenderness.  No OTHER bony tenderness over joints or long bones of the upper and lower extremities.    No neck or back midline tenderness, step-off, deformity, or bruising. Able to turn head left and right 45 degrees without difficulty.  Full range of motion of upper and lower extremity joints shown after palpation was conducted; with 5/5 symmetrical strength in upper and lower extremities. No chest wall tenderness, no facial or cranial tenderness.   Patient has intact sensation grossly in lower and upper extremities. Intact patellar and ankle reflexes. Patient able to ambulate without difficulty.  Radial and DP pulses palpated BL.     Skin:    General: Skin is warm and dry.     Capillary Refill: Capillary refill takes less than 2 seconds.  Neurological:     Mental Status: She is alert. Mental status is at baseline.  Psychiatric:        Mood and Affect: Mood normal.        Behavior: Behavior normal.    ED Results / Procedures / Treatments   Labs (all labs ordered are listed, but only abnormal results are displayed) Labs Reviewed - No data to display  EKG None  Radiology DG Shoulder Right  Result Date: 05/08/2021 CLINICAL DATA:  Right shoulder pain after fall. EXAM: RIGHT SHOULDER - 2+ VIEW COMPARISON:  None. FINDINGS: Mildly displaced proximal right humeral head fracture is noted. Leno humeral joint is otherwise unremarkable. IMPRESSION: Mildly displaced proximal right humeral head fracture. Electronically Signed   By: Lupita Raider M.D.   On: 05/08/2021 11:58   DG Wrist Complete Right  Result Date: 05/08/2021 CLINICAL DATA:  Right wrist pain after fall.  EXAM: RIGHT WRIST - COMPLETE 3+ VIEW COMPARISON:  None. FINDINGS: There is no evidence of fracture or dislocation. There is no evidence of arthropathy or other focal bone abnormality. Soft tissues are unremarkable. IMPRESSION: Negative. Electronically Signed   By: Lupita Raider M.D.   On: 05/08/2021 11:59    Procedures Procedures    Medications Ordered in ED Medications  oxyCODONE-acetaminophen (PERCOCET/ROXICET) 5-325 MG per tablet 2 tablet (2 tablets Oral Given 05/08/21 1335)    ED Course/ Medical Decision Making/ A&P                           Medical Decision Making Amount and/or Complexity of Data Reviewed Radiology: ordered.  Risk  Prescription drug management.   This patient presents to the ED for concern of right shoulder, right wrist, fall, this involves a number of treatment options, and is a complaint that carries with it a low/moderate risk of complications and morbidity.  The differential diagnosis includes fractures   Co morbidities: Discussed in HPI   Brief History:  Patient had mechanical fall caught her self on her right wrist and had sudden onset of right shoulder pain physical exam notable for tenderness to palpation of right shoulder and some questionable/mild snuffbox tenderness of the right wrist.  EMR reviewed including pt PMHx, past surgical history and past visits to ER.   See HPI for more details   Lab Tests:  I ordered and independently interpreted labs.  The pertinent results include:    No labs ordered  Imaging Studies:  Abnormal findings. I personally reviewed all imaging studies. Imaging notable for  Right humerus fracture.  Not displaced.  Placed in sling.  Distally neurovascularly intact after sling placement.  I personally reviewed x-ray of shoulder and wrist denies any fracture of the right wrist.  Will place in thumb spica given some concern for occult fracture  Cardiac Monitoring:  NA NA   Medicines ordered:  I ordered  medication including Percocet for pain Reevaluation of the patient after these medicines showed that the patient improved I have reviewed the patients home medicines and have made adjustments as needed   Attending I discussed this case with my attending physician who cosigned this note including patient's presenting symptoms, physical exam, and planned diagnostics and interventions. Attending physician stated agreement with plan or made changes to plan which were implemented.   Attending physician assessed patient at bedside.    Consults:     Reevaluation:  After the interventions noted above I re-evaluated patient and found that they have :improved   Social Determinants of Health:  The patient's social determinants of health were a factor in the care of this patient    Problem List / ED Course:  Right humerus fracture   Dispostion:  After consideration of the diagnostic results and the patients response to treatment, I feel that the patent would benefit from placed in sling and volar wrist splint we will follow-up with orthopedist given Percocet for pain.  She understands is a fall risk.    Final Clinical Impression(s) / ED Diagnoses Final diagnoses:  Closed fracture of head of right humerus, initial encounter  Right wrist pain    Rx / DC Orders ED Discharge Orders          Ordered    oxyCODONE-acetaminophen (PERCOCET/ROXICET) 5-325 MG tablet  Every 6 hours PRN        05/08/21 1320              Solon [place]a [place], [place] 05/08/21 1701    Blane Ohara, MD 05/09/21 1611

## 2021-05-09 DIAGNOSIS — G20 Parkinson's disease: Secondary | ICD-10-CM | POA: Diagnosis not present

## 2021-05-09 DIAGNOSIS — Z8673 Personal history of transient ischemic attack (TIA), and cerebral infarction without residual deficits: Secondary | ICD-10-CM | POA: Diagnosis not present

## 2021-05-10 DIAGNOSIS — E1159 Type 2 diabetes mellitus with other circulatory complications: Secondary | ICD-10-CM | POA: Diagnosis not present

## 2021-05-10 DIAGNOSIS — R569 Unspecified convulsions: Secondary | ICD-10-CM | POA: Diagnosis not present

## 2021-05-10 DIAGNOSIS — R69 Illness, unspecified: Secondary | ICD-10-CM | POA: Diagnosis not present

## 2021-05-10 DIAGNOSIS — E7800 Pure hypercholesterolemia, unspecified: Secondary | ICD-10-CM | POA: Diagnosis not present

## 2021-05-10 DIAGNOSIS — G20 Parkinson's disease: Secondary | ICD-10-CM | POA: Diagnosis not present

## 2021-05-10 DIAGNOSIS — G3183 Dementia with Lewy bodies: Secondary | ICD-10-CM | POA: Diagnosis not present

## 2021-05-10 DIAGNOSIS — J45909 Unspecified asthma, uncomplicated: Secondary | ICD-10-CM | POA: Diagnosis not present

## 2021-05-10 DIAGNOSIS — K219 Gastro-esophageal reflux disease without esophagitis: Secondary | ICD-10-CM | POA: Diagnosis not present

## 2021-05-10 DIAGNOSIS — I2510 Atherosclerotic heart disease of native coronary artery without angina pectoris: Secondary | ICD-10-CM | POA: Diagnosis not present

## 2021-05-11 ENCOUNTER — Encounter: Payer: Self-pay | Admitting: Neurology

## 2021-05-11 DIAGNOSIS — S42211A Unspecified displaced fracture of surgical neck of right humerus, initial encounter for closed fracture: Secondary | ICD-10-CM | POA: Diagnosis not present

## 2021-05-16 DIAGNOSIS — S42211A Unspecified displaced fracture of surgical neck of right humerus, initial encounter for closed fracture: Secondary | ICD-10-CM | POA: Diagnosis not present

## 2021-05-17 DIAGNOSIS — F32A Depression, unspecified: Secondary | ICD-10-CM | POA: Diagnosis not present

## 2021-05-17 DIAGNOSIS — F419 Anxiety disorder, unspecified: Secondary | ICD-10-CM | POA: Diagnosis not present

## 2021-05-17 DIAGNOSIS — R69 Illness, unspecified: Secondary | ICD-10-CM | POA: Diagnosis not present

## 2021-05-18 DIAGNOSIS — D518 Other vitamin B12 deficiency anemias: Secondary | ICD-10-CM | POA: Diagnosis not present

## 2021-05-18 DIAGNOSIS — G300 Alzheimer's disease with early onset: Secondary | ICD-10-CM | POA: Diagnosis not present

## 2021-05-18 DIAGNOSIS — E559 Vitamin D deficiency, unspecified: Secondary | ICD-10-CM | POA: Diagnosis not present

## 2021-05-18 DIAGNOSIS — E785 Hyperlipidemia, unspecified: Secondary | ICD-10-CM | POA: Diagnosis not present

## 2021-05-18 DIAGNOSIS — E11649 Type 2 diabetes mellitus with hypoglycemia without coma: Secondary | ICD-10-CM | POA: Diagnosis not present

## 2021-05-18 DIAGNOSIS — E038 Other specified hypothyroidism: Secondary | ICD-10-CM | POA: Diagnosis not present

## 2021-05-18 DIAGNOSIS — I10 Essential (primary) hypertension: Secondary | ICD-10-CM | POA: Diagnosis not present

## 2021-05-18 DIAGNOSIS — E119 Type 2 diabetes mellitus without complications: Secondary | ICD-10-CM | POA: Diagnosis not present

## 2021-05-29 DIAGNOSIS — F419 Anxiety disorder, unspecified: Secondary | ICD-10-CM | POA: Diagnosis not present

## 2021-05-29 DIAGNOSIS — F32A Depression, unspecified: Secondary | ICD-10-CM | POA: Diagnosis not present

## 2021-05-29 DIAGNOSIS — R69 Illness, unspecified: Secondary | ICD-10-CM | POA: Diagnosis not present

## 2021-05-31 DIAGNOSIS — S42211A Unspecified displaced fracture of surgical neck of right humerus, initial encounter for closed fracture: Secondary | ICD-10-CM | POA: Diagnosis not present
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
## 2021-06-12 ENCOUNTER — Telehealth: Payer: Self-pay | Admitting: Neurology

## 2021-06-12 ENCOUNTER — Encounter: Payer: Self-pay | Admitting: Neurology

## 2021-06-12 ENCOUNTER — Ambulatory Visit: Payer: Medicare HMO | Admitting: Neurology

## 2021-06-12 VITALS — BP 111/62 | HR 70 | Ht 66.0 in | Wt 136.8 lb

## 2021-06-12 DIAGNOSIS — G3183 Dementia with Lewy bodies: Secondary | ICD-10-CM

## 2021-06-12 DIAGNOSIS — F419 Anxiety disorder, unspecified: Secondary | ICD-10-CM

## 2021-06-12 DIAGNOSIS — F02B0 Dementia in other diseases classified elsewhere, moderate, without behavioral disturbance, psychotic disturbance, mood disturbance, and anxiety: Secondary | ICD-10-CM | POA: Diagnosis not present

## 2021-06-12 DIAGNOSIS — R69 Illness, unspecified: Secondary | ICD-10-CM | POA: Diagnosis not present

## 2021-06-12 DIAGNOSIS — F32A Depression, unspecified: Secondary | ICD-10-CM

## 2021-06-12 DIAGNOSIS — I6381 Other cerebral infarction due to occlusion or stenosis of small artery: Secondary | ICD-10-CM

## 2021-06-12 MED ORDER — CARBIDOPA-LEVODOPA ER 25-100 MG PO TBCR: 1.0000 | EXTENDED_RELEASE_TABLET | Freq: Two times a day (BID) | ORAL | 11 refills | Status: AC

## 2021-06-12 MED ORDER — MEMANTINE HCL 5 MG PO TABS: 5.0000 mg | ORAL_TABLET | Freq: Two times a day (BID) | ORAL | 11 refills | Status: DC

## 2021-06-12 MED ORDER — ASPIRIN EC 81 MG PO TBEC: 81.0000 mg | DELAYED_RELEASE_TABLET | Freq: Every day | ORAL | 11 refills | Status: DC

## 2021-06-12 MED ORDER — ASPIRIN EC 81 MG PO TBEC: 81.0000 mg | DELAYED_RELEASE_TABLET | Freq: Every day | ORAL | 11 refills | Status: AC

## 2021-06-12 NOTE — Telephone Encounter (Signed)
Referral sent to [facility] [phone]. ?

## 2021-06-12 NOTE — Patient Instructions (Addendum)
Gina Lozano at [facility] Address: [address], [place], [place] [address] Phone: [phone] - will refer for anxiety, depression and lewy body dementia to manage behavioral and mood issues  Aspirin daily for stroke prevention. 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 MEMANTINE (MEM an teen) is used to treat dementia caused by Alzheimer's disease. This medicine may be used for other purposes; ask your health care provider or pharmacist if you have questions. COMMON BRAND NAME(S): Namenda What should I tell my care team before I take this medication? They need to know if you have any of these conditions: difficulty passing urine kidney disease liver disease seizures an unusual or allergic reaction to memantine, other medicines, foods, dyes, or preservatives pregnant or trying to get pregnant breast-feeding How should I use this medication? Take this medicine by mouth with a glass of water. Follow the directions on the prescription label. You may take this medicine with or without food. Take your doses at regular intervals. Do not take your medicine more often than directed. Continue to take your medicine even if you feel better. Do not stop taking except on the advice of your doctor or health care professional. Talk to your pediatrician regarding the use of this medicine in children. Special care may be needed. Overdosage: If you think you have taken too much of this medicine contact a poison control center or emergency room at once. NOTE: This medicine is only for you. Do not share this medicine with others. What if I miss a dose? If you miss a dose, take it as soon as you can. If it is almost time for your next dose, take only that dose. Do not take double or  extra doses. If you do not take your medicine for several days, contact your health care provider. Your dose may need to be changed. What may interact with this medication? acetazolamide amantadine cimetidine dextromethorphan dofetilide hydrochlorothiazide ketamine metformin methazolamide quinidine ranitidine sodium bicarbonate triamterene This list may not describe all possible interactions. Give your health care provider a list of all the medicines, herbs, non-prescription drugs, or dietary supplements you use. Also tell them if you smoke, drink alcohol, or use illegal drugs. Some items may interact with your medicine. What should I watch for while using this medication? Visit your doctor or health care professional for regular checks on your progress. Check with your doctor or health care professional if there is no improvement in your symptoms or if they get worse. You may get drowsy or dizzy. Do not drive, use machinery, or do anything that needs mental alertness until you know how this drug affects you. Do not stand or sit up quickly, especially if you are an older patient. This reduces the risk of dizzy or fainting spells. Alcohol can make you more drowsy and dizzy. Avoid alcoholic drinks. What side effects may I notice from receiving this medication? Side effects that you should report to your doctor or health care professional as soon as possible: allergic reactions like skin rash, itching or hives, swelling of the face, lips, or tongue agitation or a feeling of restlessness depressed mood dizziness hallucinations redness, blistering, peeling or loosening of the skin, including inside the mouth seizures vomiting Side effects that usually do not require medical attention (report to your  doctor or health care professional if they continue or are bothersome): constipation diarrhea headache nausea trouble sleeping This list may not describe all possible side effects. Call your  doctor for medical advice about side effects. You may report side effects to FDA at 1-800-FDA-1088. Where should I keep my medication? Keep out of the reach of children. Store at room temperature between 15 degrees and 30 degrees C (59 degrees and 86 degrees F). Throw away any unused medicine after the expiration date. NOTE: This sheet is a summary. It may not cover all possible information. If you have questions about this medicine, talk to your doctor, pharmacist, or health care provider.  2022 Elsevier/Gold Standard (2013-01-12 00:00:00)   Lacunar Stroke A lacunar stroke (lacunar infarction) happens when an area of the brain does not get enough oxygen and blood flow. This can lead to permanent brain damage. A lacunar stroke is a medical emergency. It must be treated right away. What are the causes? This condition is caused by a blockage in a small artery deep in the brain. This may be due to: A buildup of fatty deposits that causes the arteries to narrow (atherosclerosis). This blocks blood flow in the brain. High blood pressure. What increases the risk? You are more likely to develop this condition if you: Have high blood pressure (hypertension). Have high cholesterol (hyperlipidemia). Smoke. Have diabetes. Have heart disease or artery disease, such as carotid artery disease or peripheral artery disease. Are age 94 or older. Have a personal or family history of stroke. What are the signs or symptoms? Symptoms of this condition usually develop suddenly. They may include: Weakness or numbness in your face, arm, or leg, especially on one side of your body. Trouble walking or moving your arms or legs. Loss of balance or coordination. Slurred speech. Vision problems. Dizziness. Nausea and vomiting. Severe headache. How is this diagnosed? This condition may be diagnosed based on: Your symptoms and medical history. A physical exam. Blood tests. A CT scan or MRI of the brain. How  is this treated? This condition must be treated within 3-4 hours of the start of the stroke. The goal is to restore blood flow to the brain as soon as possible. Treatments may include: Medicine given through an IV to dissolve the blood clot. Blood thinners (antiplatelets). Medicines to control blood pressure. Your health care provider may prescribe blood thinners to lower your risk of another stroke. Follow these instructions at home: Medicines Take over-the-counter and prescription medicines only as told by your health care provider. If you were told to take a medicine to thin your blood, such as aspirin, take it exactly as told by your health care provider. Taking too much blood-thinning medicine can cause bleeding. Taking too little may not protect you against a stroke and other problems. Eating and drinking Follow instructions from your health care provider about diet. Eat healthy foods. This includes plenty of fruits and vegetables, lean meats, whole grains, and low-fat dairy products. Avoid foods high in saturated fat, trans fat, or sodium. If you drink alcohol: Limit how much you have to: 0-1 drink a day for women. 0-2 drinks a day for men. Know how much alcohol is in your drink. In the [place] one drink equals 12 oz of beer, 5 oz of wine, or 1 oz of hard liquor. Safety If you need help walking, use a cane or walker as told by your health care provider. Take steps to lower the risk of falls in your home.  This may include: Using raised toilets and a seat in the shower. Removing clutter and tripping hazards, such as cords or area rugs. Installing grab bars in the bedroom and bathroom. Activity Exercise regularly, as told by your health care provider. Take part in rehabilitation programs as told by your health care provider. This may include physical therapy, occupational therapy, or speech therapy. General instructions Do not use any products that contain nicotine or tobacco. These  products include cigarettes, chewing tobacco, and vaping devices, such as e-cigarettes. If you need help quitting, ask your health care provider. Keep all follow-up visits. This is important. Get help right away if:  You have any symptoms of stroke. "BE FAST" is an easy way to remember the main warning signs of stroke: B - Balance. Signs are dizziness, sudden trouble walking, or loss of balance. E - Eyes. Signs are trouble seeing or a sudden change in vision. F - Face. Signs are sudden weakness or numbness of the face, or the face or eyelid drooping on one side. A - Arms. Signs are weakness or numbness in an arm. This happens suddenly and usually on one side of the body. S - Speech. Signs are sudden trouble speaking, slurred speech, or trouble understanding what people say. T - Time. Time to call emergency services. Write down what time symptoms started. You have other signs of stroke, such as: A sudden, severe headache. Nausea or vomiting. Seizure. You have a severe fall or injury. These symptoms may represent a serious problem that is an emergency. Do not wait to see if the symptoms will go away. Get medical help right away. Call your local emergency services (911 in the [place] Do not drive yourself to the hospital. Summary A lacunar stroke (lacunar infarction) happens when an area of the brain does not get enough oxygen. This can lead to permanent brain damage. This condition is a medical emergency that must be treated right away. Treatments must be done within 3-4 hours of the start of the stroke. Controlling your risk factors for stroke is the best way to avoid another lacunar stroke. Get help right away if you have any symptoms of stroke. "BE FAST" is an easy way to remember the main warning signs of stroke. This information is not intended to replace advice given to you by your health care provider. Make sure you discuss any questions you have with your health care provider. Document  Revised: 10/14/2019 Document Reviewed: 10/14/2019 Elsevier Patient Education  [place].   Lewy Body Dementia Lewy body dementia, also called dementia with Lewy bodies, is a condition that affects the way the brain functions. It is one form of dementia. In this condition, proteins called Lewy bodies build up in certain areas of the brain. This causes problems with: Memory. Decision making. Behavior. Speaking. Thinking and problem solving. Movements and balance. This condition is progressive, which means that it gets worse with time (is degenerative) and cannot be reversed. What are the causes? This condition is caused by the buildup of Lewy bodies in brain cells in areas of the brain that control memory, thinking, and movement. It is not known what causes the Lewy bodies to build up. What increases the risk? You are more likely to develop this condition if you: Have a family history of Lewy body dementia or Parkinson's disease. Are 9 years old or older. Are female. What are the signs or symptoms? Symptoms of this condition may include: Seeing things that are not there (hallucinating).  Sleep problems, such as acting out dreams while you are asleep. Changes in memory, attention, and concentration that come and go (fluctuate). Symptoms of dementia, such as: Trouble with memory. Trouble paying attention. Problems with planning and organizing. Problems with judgment. Behavioral problems. Symptoms of Parkinson's disease, such as: Shaking movements that you cannot control (tremor). Tremors usually start in a hand or foot when you are resting (resting tremor). Stooped posture. Slowing of movement. Stiff muscles (rigidity). Loss of balance and stability when standing. How is this diagnosed? This condition is diagnosed by a specialist who diagnoses and treats this condition (neurologist). Your health care provider will talk with you and your family, friends, or caregivers about  your history and symptoms. A thorough medical history will be taken, and you will have a physical exam and tests. Tests may include: Lab tests, such as blood or urine tests. Imaging tests, such as a CT scan, a PET scan, or an MRI. A test that involves removing and testing a small amount of the fluid that surrounds the brain and spinal cord (lumbar puncture). Tests that evaluate brain function, such as memory tests, cognitive tests, and neuropsychological tests. How is this treated? There is no cure for this condition. Treatment focuses on managing your symptoms. Treatment may include: Medicines to help with hallucinations, sleep, and behavioral problems. Speech, occupational, and physical therapy. Your health care provider can help direct you to support groups, organizations, and other health care providers who can help with decisions about your care. Follow these instructions at home: Medicines Take over-the-counter and prescription medicines only as told by your health care provider. To help you manage your medicines, use a pill organizer or pill reminder. Avoid taking medicines that can affect thinking, such as pain medicines or certain sleeping medicines. Always check with your health care provider before taking any new medicines. Lifestyle Make healthy lifestyle choices: Be physically active as told by your health care provider. Do not use any products that contain nicotine or tobacco, such as cigarettes, e-cigarettes, and chewing tobacco. If you need help quitting, ask your health care provider. Try to practice stress-management techniques when you experience stress, such as mindfulness, yoga, or deep breathing. Stay socially connected. Talk regularly with other people, such as family, friends, and neighbors. Make sure you sleep well. These tips can help you get a good night's rest: Avoid napping during the day. Keep your sleeping area dark and cool. Avoid exercising a few hours before  you go to bed. Avoid caffeine products in the evening. Eating and drinking Do not drink alcohol. Drink enough fluid to keep your urine pale yellow. Eat a healthy diet. Safety  Work with your health care provider to determine what you need help with and what your safety needs are. If you have trouble moving around, use a cane or walker as told by your health care provider. Make sure your home environment is safe. To do this: Remove things that can be a tripping hazard, such as throw rugs or clutter. Install grab bars and railings in your home to prevent falls. Talk with your health care provider about whether it is safe for you to drive. If you were given a bracelet that identifies you as a person with memory loss or tracks your location, make sure to wear it at all times. General instructions  Work with your family to make important decisions, such as advance directives, medical power of attorney, or a living will. Keep all follow-up visits. This is important. Where  to find more information [employer] of Neurological Disorders and Stroke: [employer] Contact a health care provider if you have: New or worsening confusion. New or worsening trouble with sleeping or increased daytime sleepiness. Problems with choking or swallowing. Get help right away if: You feel depressed or sad, or feel that you want to harm yourself. Your family members become concerned for your safety. If you ever feel like you may hurt yourself or others, or have thoughts about taking your own life, get help right away. Go to your nearest emergency department or: Call your local emergency services (911 in the [place] Call a suicide crisis helpline, such as the [place] at 651-181-3408 or 988 in the [place]. This is open 24 hours a day in the [place] Text the Crisis Text Line at 856-521-6453 (in the [place].). Summary Lewy body dementia is a condition that affects the way the brain  functions. It causes problems with functions such as memory and thinking. Lewy body dementia gets worse with time. This condition is caused by the buildup of proteins called Lewy bodies in brain cells. It is not known what causes the Lewy bodies to build up. Work with your health care provider to determine what you need help with and what your safety needs are. Your health care provider can help direct you to support groups, organizations, and other health care providers who can help with decisions about your care. This information is not intended to replace advice given to you by your health care provider. Make sure you discuss any questions you have with your health care provider. Document Revised: 10/18/2020 Document Reviewed: 08/09/2019 Elsevier Patient Education  [place].

## 2021-06-13 DIAGNOSIS — E038 Other specified hypothyroidism: Secondary | ICD-10-CM | POA: Diagnosis not present

## 2021-06-13 DIAGNOSIS — E119 Type 2 diabetes mellitus without complications: Secondary | ICD-10-CM | POA: Diagnosis not present

## 2021-06-13 DIAGNOSIS — E11649 Type 2 diabetes mellitus with hypoglycemia without coma: Secondary | ICD-10-CM | POA: Diagnosis not present

## 2021-06-13 DIAGNOSIS — D518 Other vitamin B12 deficiency anemias: Secondary | ICD-10-CM | POA: Diagnosis not present

## 2021-06-13 DIAGNOSIS — E559 Vitamin D deficiency, unspecified: Secondary | ICD-10-CM | POA: Diagnosis not present

## 2021-06-13 DIAGNOSIS — E785 Hyperlipidemia, unspecified: Secondary | ICD-10-CM | POA: Diagnosis not present

## 2021-06-13 DIAGNOSIS — I10 Essential (primary) hypertension: Secondary | ICD-10-CM | POA: Diagnosis not present

## 2021-06-13 DIAGNOSIS — G300 Alzheimer's disease with early onset: Secondary | ICD-10-CM | POA: Diagnosis not present

## 2021-06-13 MED ORDER — MEMANTINE HCL 5 MG PO TABS: 5.0000 mg | ORAL_TABLET | Freq: Two times a day (BID) | ORAL | 11 refills | Status: AC

## 2021-06-13 NOTE — Addendum Note (Signed)
Addended by: Bertram Savin on: 06/13/2021 08:29 AM ? ? Modules accepted: Orders ? ?

## 2021-06-15 DIAGNOSIS — S42211A Unspecified displaced fracture of surgical neck of right humerus, initial encounter for closed fracture: Secondary | ICD-10-CM | POA: Diagnosis not present

## 2021-06-19 DIAGNOSIS — Z9181 History of falling: Secondary | ICD-10-CM | POA: Diagnosis not present

## 2021-06-19 DIAGNOSIS — Z1331 Encounter for screening for depression: Secondary | ICD-10-CM | POA: Diagnosis not present

## 2021-06-19 DIAGNOSIS — E1159 Type 2 diabetes mellitus with other circulatory complications: Secondary | ICD-10-CM | POA: Diagnosis not present

## 2021-06-19 DIAGNOSIS — R69 Illness, unspecified: Secondary | ICD-10-CM | POA: Diagnosis not present

## 2021-06-19 DIAGNOSIS — E669 Obesity, unspecified: Secondary | ICD-10-CM | POA: Diagnosis not present

## 2021-06-19 DIAGNOSIS — Z0000 Encounter for general adult medical examination without abnormal findings: Secondary | ICD-10-CM | POA: Diagnosis not present

## 2021-06-19 DIAGNOSIS — E785 Hyperlipidemia, unspecified: Secondary | ICD-10-CM | POA: Diagnosis not present

## 2021-06-19 DIAGNOSIS — G3183 Dementia with Lewy bodies: Secondary | ICD-10-CM | POA: Diagnosis not present

## 2021-06-19 DIAGNOSIS — Z6822 Body mass index (BMI) 22.0-22.9, adult: Secondary | ICD-10-CM | POA: Diagnosis not present

## 2021-06-19 DIAGNOSIS — F319 Bipolar disorder, unspecified: Secondary | ICD-10-CM | POA: Diagnosis not present

## 2021-06-19 DIAGNOSIS — F0280 Dementia in other diseases classified elsewhere without behavioral disturbance: Secondary | ICD-10-CM | POA: Diagnosis not present

## 2021-06-19 DIAGNOSIS — N959 Unspecified menopausal and perimenopausal disorder: Secondary | ICD-10-CM | POA: Diagnosis not present

## 2021-06-19 DIAGNOSIS — J45909 Unspecified asthma, uncomplicated: Secondary | ICD-10-CM | POA: Diagnosis not present

## 2021-06-20 DIAGNOSIS — R69 Illness, unspecified: Secondary | ICD-10-CM | POA: Diagnosis not present

## 2021-06-20 DIAGNOSIS — F419 Anxiety disorder, unspecified: Secondary | ICD-10-CM | POA: Diagnosis not present

## 2021-06-20 DIAGNOSIS — G3183 Dementia with Lewy bodies: Secondary | ICD-10-CM | POA: Diagnosis not present

## 2021-07-01 DIAGNOSIS — I10 Essential (primary) hypertension: Secondary | ICD-10-CM | POA: Diagnosis not present

## 2021-07-16 DIAGNOSIS — E1159 Type 2 diabetes mellitus with other circulatory complications: Secondary | ICD-10-CM | POA: Diagnosis not present

## 2021-07-16 DIAGNOSIS — R110 Nausea: Secondary | ICD-10-CM | POA: Diagnosis not present

## 2021-07-16 DIAGNOSIS — R69 Illness, unspecified: Secondary | ICD-10-CM | POA: Diagnosis not present

## 2021-07-19 DIAGNOSIS — G300 Alzheimer's disease with early onset: Secondary | ICD-10-CM | POA: Diagnosis not present

## 2021-07-19 DIAGNOSIS — E119 Type 2 diabetes mellitus without complications: Secondary | ICD-10-CM | POA: Diagnosis not present

## 2021-07-19 DIAGNOSIS — D518 Other vitamin B12 deficiency anemias: Secondary | ICD-10-CM | POA: Diagnosis not present

## 2021-07-19 DIAGNOSIS — E11649 Type 2 diabetes mellitus with hypoglycemia without coma: Secondary | ICD-10-CM | POA: Diagnosis not present

## 2021-07-19 DIAGNOSIS — E559 Vitamin D deficiency, unspecified: Secondary | ICD-10-CM | POA: Diagnosis not present

## 2021-07-19 DIAGNOSIS — E038 Other specified hypothyroidism: Secondary | ICD-10-CM | POA: Diagnosis not present

## 2021-07-19 DIAGNOSIS — E785 Hyperlipidemia, unspecified: Secondary | ICD-10-CM | POA: Diagnosis not present

## 2021-07-19 DIAGNOSIS — I10 Essential (primary) hypertension: Secondary | ICD-10-CM | POA: Diagnosis not present

## 2021-07-23 DIAGNOSIS — Z8673 Personal history of transient ischemic attack (TIA), and cerebral infarction without residual deficits: Secondary | ICD-10-CM | POA: Diagnosis not present

## 2021-07-23 DIAGNOSIS — R69 Illness, unspecified: Secondary | ICD-10-CM | POA: Diagnosis not present

## 2021-07-23 DIAGNOSIS — R0789 Other chest pain: Secondary | ICD-10-CM | POA: Diagnosis not present

## 2021-07-23 DIAGNOSIS — I10 Essential (primary) hypertension: Secondary | ICD-10-CM | POA: Diagnosis not present

## 2021-07-23 DIAGNOSIS — E119 Type 2 diabetes mellitus without complications: Secondary | ICD-10-CM | POA: Diagnosis not present

## 2021-07-23 DIAGNOSIS — I2510 Atherosclerotic heart disease of native coronary artery without angina pectoris: Secondary | ICD-10-CM | POA: Diagnosis not present

## 2021-07-23 DIAGNOSIS — I4891 Unspecified atrial fibrillation: Secondary | ICD-10-CM | POA: Diagnosis not present

## 2021-07-23 DIAGNOSIS — I252 Old myocardial infarction: Secondary | ICD-10-CM | POA: Diagnosis not present

## 2021-07-23 DIAGNOSIS — Z743 Need for continuous supervision: Secondary | ICD-10-CM | POA: Diagnosis not present

## 2021-07-23 DIAGNOSIS — R911 Solitary pulmonary nodule: Secondary | ICD-10-CM | POA: Diagnosis not present

## 2021-07-23 DIAGNOSIS — R079 Chest pain, unspecified: Secondary | ICD-10-CM | POA: Diagnosis not present

## 2021-07-24 DIAGNOSIS — R410 Disorientation, unspecified: Secondary | ICD-10-CM | POA: Diagnosis not present

## 2021-07-24 DIAGNOSIS — Z743 Need for continuous supervision: Secondary | ICD-10-CM | POA: Diagnosis not present

## 2021-07-24 DIAGNOSIS — R079 Chest pain, unspecified: Secondary | ICD-10-CM | POA: Diagnosis not present

## 2021-07-25 DIAGNOSIS — G3183 Dementia with Lewy bodies: Secondary | ICD-10-CM | POA: Diagnosis not present

## 2021-07-25 DIAGNOSIS — R69 Illness, unspecified: Secondary | ICD-10-CM | POA: Diagnosis not present

## 2021-07-25 DIAGNOSIS — F419 Anxiety disorder, unspecified: Secondary | ICD-10-CM | POA: Diagnosis not present

## 2021-07-30 DIAGNOSIS — Z111 Encounter for screening for respiratory tuberculosis: Secondary | ICD-10-CM | POA: Diagnosis not present

## 2021-08-01 DIAGNOSIS — N390 Urinary tract infection, site not specified: Secondary | ICD-10-CM | POA: Diagnosis not present

## 2021-08-08 DIAGNOSIS — G300 Alzheimer's disease with early onset: Secondary | ICD-10-CM | POA: Diagnosis not present

## 2021-08-08 DIAGNOSIS — E11649 Type 2 diabetes mellitus with hypoglycemia without coma: Secondary | ICD-10-CM | POA: Diagnosis not present

## 2021-08-08 DIAGNOSIS — I10 Essential (primary) hypertension: Secondary | ICD-10-CM | POA: Diagnosis not present

## 2021-08-08 DIAGNOSIS — E038 Other specified hypothyroidism: Secondary | ICD-10-CM | POA: Diagnosis not present

## 2021-08-08 DIAGNOSIS — E559 Vitamin D deficiency, unspecified: Secondary | ICD-10-CM | POA: Diagnosis not present

## 2021-08-08 DIAGNOSIS — E785 Hyperlipidemia, unspecified: Secondary | ICD-10-CM | POA: Diagnosis not present

## 2021-08-08 DIAGNOSIS — E119 Type 2 diabetes mellitus without complications: Secondary | ICD-10-CM | POA: Diagnosis not present

## 2021-08-08 DIAGNOSIS — D518 Other vitamin B12 deficiency anemias: Secondary | ICD-10-CM | POA: Diagnosis not present

## 2021-08-13 DIAGNOSIS — M159 Polyosteoarthritis, unspecified: Secondary | ICD-10-CM | POA: Diagnosis not present

## 2021-08-13 DIAGNOSIS — G3183 Dementia with Lewy bodies: Secondary | ICD-10-CM | POA: Diagnosis not present

## 2021-08-13 DIAGNOSIS — E1159 Type 2 diabetes mellitus with other circulatory complications: Secondary | ICD-10-CM | POA: Diagnosis not present

## 2021-08-13 DIAGNOSIS — G20 Parkinson's disease: Secondary | ICD-10-CM | POA: Diagnosis not present

## 2021-08-13 DIAGNOSIS — K219 Gastro-esophageal reflux disease without esophagitis: Secondary | ICD-10-CM | POA: Diagnosis not present

## 2021-08-13 DIAGNOSIS — I2510 Atherosclerotic heart disease of native coronary artery without angina pectoris: Secondary | ICD-10-CM | POA: Diagnosis not present

## 2021-08-13 DIAGNOSIS — J45909 Unspecified asthma, uncomplicated: Secondary | ICD-10-CM | POA: Diagnosis not present

## 2021-08-13 DIAGNOSIS — Z139 Encounter for screening, unspecified: Secondary | ICD-10-CM | POA: Diagnosis not present

## 2021-08-13 DIAGNOSIS — R69 Illness, unspecified: Secondary | ICD-10-CM | POA: Diagnosis not present

## 2021-08-13 DIAGNOSIS — R110 Nausea: Secondary | ICD-10-CM | POA: Diagnosis not present

## 2021-08-27 DIAGNOSIS — E7800 Pure hypercholesterolemia, unspecified: Secondary | ICD-10-CM | POA: Diagnosis not present

## 2021-08-27 DIAGNOSIS — Z6822 Body mass index (BMI) 22.0-22.9, adult: Secondary | ICD-10-CM | POA: Diagnosis not present

## 2021-08-27 DIAGNOSIS — R69 Illness, unspecified: Secondary | ICD-10-CM | POA: Diagnosis not present

## 2021-08-27 DIAGNOSIS — G3183 Dementia with Lewy bodies: Secondary | ICD-10-CM | POA: Diagnosis not present

## 2021-08-27 DIAGNOSIS — J0100 Acute maxillary sinusitis, unspecified: Secondary | ICD-10-CM | POA: Diagnosis not present

## 2021-08-27 DIAGNOSIS — J45909 Unspecified asthma, uncomplicated: Secondary | ICD-10-CM | POA: Diagnosis not present

## 2021-08-27 DIAGNOSIS — I10 Essential (primary) hypertension: Secondary | ICD-10-CM | POA: Diagnosis not present

## 2021-08-27 DIAGNOSIS — M159 Polyosteoarthritis, unspecified: Secondary | ICD-10-CM | POA: Diagnosis not present

## 2021-08-27 DIAGNOSIS — E1159 Type 2 diabetes mellitus with other circulatory complications: Secondary | ICD-10-CM | POA: Diagnosis not present

## 2021-08-27 DIAGNOSIS — R110 Nausea: Secondary | ICD-10-CM | POA: Diagnosis not present

## 2021-09-07 DIAGNOSIS — E1165 Type 2 diabetes mellitus with hyperglycemia: Secondary | ICD-10-CM | POA: Diagnosis not present

## 2021-09-07 DIAGNOSIS — Z8673 Personal history of transient ischemic attack (TIA), and cerebral infarction without residual deficits: Secondary | ICD-10-CM | POA: Diagnosis not present

## 2021-09-07 DIAGNOSIS — I2510 Atherosclerotic heart disease of native coronary artery without angina pectoris: Secondary | ICD-10-CM | POA: Diagnosis not present

## 2021-09-07 DIAGNOSIS — Z79899 Other long term (current) drug therapy: Secondary | ICD-10-CM | POA: Diagnosis not present

## 2021-09-07 DIAGNOSIS — R69 Illness, unspecified: Secondary | ICD-10-CM | POA: Diagnosis not present

## 2021-09-07 DIAGNOSIS — I252 Old myocardial infarction: Secondary | ICD-10-CM | POA: Diagnosis not present

## 2021-09-07 DIAGNOSIS — I4891 Unspecified atrial fibrillation: Secondary | ICD-10-CM | POA: Diagnosis not present

## 2021-09-07 DIAGNOSIS — J449 Chronic obstructive pulmonary disease, unspecified: Secondary | ICD-10-CM | POA: Diagnosis not present

## 2021-09-07 DIAGNOSIS — R55 Syncope and collapse: Secondary | ICD-10-CM | POA: Diagnosis not present

## 2021-09-15 DIAGNOSIS — E785 Hyperlipidemia, unspecified: Secondary | ICD-10-CM | POA: Diagnosis not present

## 2021-09-15 DIAGNOSIS — I10 Essential (primary) hypertension: Secondary | ICD-10-CM | POA: Diagnosis not present

## 2021-09-15 DIAGNOSIS — E11649 Type 2 diabetes mellitus with hypoglycemia without coma: Secondary | ICD-10-CM | POA: Diagnosis not present

## 2021-09-15 DIAGNOSIS — E119 Type 2 diabetes mellitus without complications: Secondary | ICD-10-CM | POA: Diagnosis not present

## 2021-09-15 DIAGNOSIS — G300 Alzheimer's disease with early onset: Secondary | ICD-10-CM | POA: Diagnosis not present

## 2021-09-15 DIAGNOSIS — D518 Other vitamin B12 deficiency anemias: Secondary | ICD-10-CM | POA: Diagnosis not present

## 2021-09-15 DIAGNOSIS — E038 Other specified hypothyroidism: Secondary | ICD-10-CM | POA: Diagnosis not present

## 2021-09-15 DIAGNOSIS — E559 Vitamin D deficiency, unspecified: Secondary | ICD-10-CM | POA: Diagnosis not present

## 2021-09-18 ENCOUNTER — Ambulatory Visit (HOSPITAL_COMMUNITY): Payer: Medicare HMO | Admitting: Psychiatry

## 2021-09-19 DIAGNOSIS — Z6822 Body mass index (BMI) 22.0-22.9, adult: Secondary | ICD-10-CM | POA: Diagnosis not present

## 2021-09-19 DIAGNOSIS — G3183 Dementia with Lewy bodies: Secondary | ICD-10-CM | POA: Diagnosis not present

## 2021-09-19 DIAGNOSIS — E1159 Type 2 diabetes mellitus with other circulatory complications: Secondary | ICD-10-CM | POA: Diagnosis not present

## 2021-09-19 DIAGNOSIS — Z139 Encounter for screening, unspecified: Secondary | ICD-10-CM | POA: Diagnosis not present

## 2021-09-19 DIAGNOSIS — E7800 Pure hypercholesterolemia, unspecified: Secondary | ICD-10-CM | POA: Diagnosis not present

## 2021-09-19 DIAGNOSIS — R110 Nausea: Secondary | ICD-10-CM | POA: Diagnosis not present

## 2021-09-19 DIAGNOSIS — M159 Polyosteoarthritis, unspecified: Secondary | ICD-10-CM | POA: Diagnosis not present

## 2021-09-19 DIAGNOSIS — R69 Illness, unspecified: Secondary | ICD-10-CM | POA: Diagnosis not present

## 2021-09-24 ENCOUNTER — Ambulatory Visit (HOSPITAL_COMMUNITY): Payer: Self-pay | Admitting: Psychiatry

## 2021-10-01 DIAGNOSIS — E1165 Type 2 diabetes mellitus with hyperglycemia: Secondary | ICD-10-CM | POA: Diagnosis not present

## 2021-10-04 DIAGNOSIS — R69 Illness, unspecified: Secondary | ICD-10-CM | POA: Diagnosis not present

## 2021-10-04 DIAGNOSIS — F419 Anxiety disorder, unspecified: Secondary | ICD-10-CM | POA: Diagnosis not present

## 2021-10-04 DIAGNOSIS — G3183 Dementia with Lewy bodies: Secondary | ICD-10-CM | POA: Diagnosis not present

## 2021-10-08 DIAGNOSIS — Z6822 Body mass index (BMI) 22.0-22.9, adult: Secondary | ICD-10-CM | POA: Diagnosis not present

## 2021-10-08 DIAGNOSIS — R110 Nausea: Secondary | ICD-10-CM | POA: Diagnosis not present

## 2021-10-08 DIAGNOSIS — M159 Polyosteoarthritis, unspecified: Secondary | ICD-10-CM | POA: Diagnosis not present

## 2021-10-08 DIAGNOSIS — R69 Illness, unspecified: Secondary | ICD-10-CM | POA: Diagnosis not present

## 2021-10-08 DIAGNOSIS — G3183 Dementia with Lewy bodies: Secondary | ICD-10-CM | POA: Diagnosis not present

## 2021-10-08 DIAGNOSIS — I2510 Atherosclerotic heart disease of native coronary artery without angina pectoris: Secondary | ICD-10-CM | POA: Diagnosis not present

## 2021-10-08 DIAGNOSIS — E7800 Pure hypercholesterolemia, unspecified: Secondary | ICD-10-CM | POA: Diagnosis not present

## 2021-10-08 DIAGNOSIS — E1159 Type 2 diabetes mellitus with other circulatory complications: Secondary | ICD-10-CM | POA: Diagnosis not present

## 2021-10-08 DIAGNOSIS — J45909 Unspecified asthma, uncomplicated: Secondary | ICD-10-CM | POA: Diagnosis not present

## 2021-10-15 DIAGNOSIS — G3183 Dementia with Lewy bodies: Secondary | ICD-10-CM | POA: Diagnosis not present

## 2021-10-15 DIAGNOSIS — E7800 Pure hypercholesterolemia, unspecified: Secondary | ICD-10-CM | POA: Diagnosis not present

## 2021-10-15 DIAGNOSIS — R110 Nausea: Secondary | ICD-10-CM | POA: Diagnosis not present

## 2021-10-15 DIAGNOSIS — M159 Polyosteoarthritis, unspecified: Secondary | ICD-10-CM | POA: Diagnosis not present

## 2021-10-15 DIAGNOSIS — E1159 Type 2 diabetes mellitus with other circulatory complications: Secondary | ICD-10-CM | POA: Diagnosis not present

## 2021-10-15 DIAGNOSIS — R69 Illness, unspecified: Secondary | ICD-10-CM | POA: Diagnosis not present

## 2021-10-15 DIAGNOSIS — Z6822 Body mass index (BMI) 22.0-22.9, adult: Secondary | ICD-10-CM | POA: Diagnosis not present

## 2021-10-17 DIAGNOSIS — F419 Anxiety disorder, unspecified: Secondary | ICD-10-CM | POA: Diagnosis not present

## 2021-10-17 DIAGNOSIS — R69 Illness, unspecified: Secondary | ICD-10-CM | POA: Diagnosis not present

## 2021-10-17 DIAGNOSIS — G3183 Dementia with Lewy bodies: Secondary | ICD-10-CM | POA: Diagnosis not present

## 2021-11-02 DIAGNOSIS — I2510 Atherosclerotic heart disease of native coronary artery without angina pectoris: Secondary | ICD-10-CM | POA: Diagnosis not present

## 2021-11-02 DIAGNOSIS — Z6822 Body mass index (BMI) 22.0-22.9, adult: Secondary | ICD-10-CM | POA: Diagnosis not present

## 2021-11-02 DIAGNOSIS — M159 Polyosteoarthritis, unspecified: Secondary | ICD-10-CM | POA: Diagnosis not present

## 2021-11-02 DIAGNOSIS — R69 Illness, unspecified: Secondary | ICD-10-CM | POA: Diagnosis not present

## 2021-11-02 DIAGNOSIS — J45909 Unspecified asthma, uncomplicated: Secondary | ICD-10-CM | POA: Diagnosis not present

## 2021-11-02 DIAGNOSIS — E1159 Type 2 diabetes mellitus with other circulatory complications: Secondary | ICD-10-CM | POA: Diagnosis not present

## 2021-11-02 DIAGNOSIS — G3183 Dementia with Lewy bodies: Secondary | ICD-10-CM | POA: Diagnosis not present

## 2021-11-02 DIAGNOSIS — R110 Nausea: Secondary | ICD-10-CM | POA: Diagnosis not present

## 2021-11-02 DIAGNOSIS — E7800 Pure hypercholesterolemia, unspecified: Secondary | ICD-10-CM | POA: Diagnosis not present

## 2021-11-14 DIAGNOSIS — F419 Anxiety disorder, unspecified: Secondary | ICD-10-CM | POA: Diagnosis not present

## 2021-11-14 DIAGNOSIS — G3183 Dementia with Lewy bodies: Secondary | ICD-10-CM | POA: Diagnosis not present

## 2021-11-14 DIAGNOSIS — R69 Illness, unspecified: Secondary | ICD-10-CM | POA: Diagnosis not present

## 2021-12-03 DIAGNOSIS — R5382 Chronic fatigue, unspecified: Secondary | ICD-10-CM | POA: Diagnosis not present

## 2021-12-03 DIAGNOSIS — R69 Illness, unspecified: Secondary | ICD-10-CM | POA: Diagnosis not present

## 2021-12-03 DIAGNOSIS — E7800 Pure hypercholesterolemia, unspecified: Secondary | ICD-10-CM | POA: Diagnosis not present

## 2021-12-03 DIAGNOSIS — E1159 Type 2 diabetes mellitus with other circulatory complications: Secondary | ICD-10-CM | POA: Diagnosis not present

## 2021-12-03 DIAGNOSIS — R110 Nausea: Secondary | ICD-10-CM | POA: Diagnosis not present

## 2021-12-03 DIAGNOSIS — I2510 Atherosclerotic heart disease of native coronary artery without angina pectoris: Secondary | ICD-10-CM | POA: Diagnosis not present

## 2021-12-03 DIAGNOSIS — K219 Gastro-esophageal reflux disease without esophagitis: Secondary | ICD-10-CM | POA: Diagnosis not present

## 2021-12-03 DIAGNOSIS — J45909 Unspecified asthma, uncomplicated: Secondary | ICD-10-CM | POA: Diagnosis not present

## 2021-12-03 DIAGNOSIS — G3183 Dementia with Lewy bodies: Secondary | ICD-10-CM | POA: Diagnosis not present

## 2021-12-03 DIAGNOSIS — M159 Polyosteoarthritis, unspecified: Secondary | ICD-10-CM | POA: Diagnosis not present

## 2021-12-13 ENCOUNTER — Ambulatory Visit: Payer: Medicare HMO | Admitting: Adult Health

## 2021-12-14 DIAGNOSIS — H25811 Combined forms of age-related cataract, right eye: Secondary | ICD-10-CM | POA: Diagnosis not present

## 2021-12-14 DIAGNOSIS — Z01818 Encounter for other preprocedural examination: Secondary | ICD-10-CM | POA: Diagnosis not present

## 2021-12-17 DIAGNOSIS — R262 Difficulty in walking, not elsewhere classified: Secondary | ICD-10-CM | POA: Diagnosis not present

## 2021-12-19 DIAGNOSIS — F419 Anxiety disorder, unspecified: Secondary | ICD-10-CM | POA: Diagnosis not present

## 2021-12-19 DIAGNOSIS — R69 Illness, unspecified: Secondary | ICD-10-CM | POA: Diagnosis not present

## 2021-12-19 DIAGNOSIS — G3183 Dementia with Lewy bodies: Secondary | ICD-10-CM | POA: Diagnosis not present

## 2021-12-24 DIAGNOSIS — E7800 Pure hypercholesterolemia, unspecified: Secondary | ICD-10-CM | POA: Diagnosis not present

## 2021-12-24 DIAGNOSIS — K219 Gastro-esophageal reflux disease without esophagitis: Secondary | ICD-10-CM | POA: Diagnosis not present

## 2021-12-24 DIAGNOSIS — G3183 Dementia with Lewy bodies: Secondary | ICD-10-CM | POA: Diagnosis not present

## 2021-12-24 DIAGNOSIS — M159 Polyosteoarthritis, unspecified: Secondary | ICD-10-CM | POA: Diagnosis not present

## 2021-12-24 DIAGNOSIS — R110 Nausea: Secondary | ICD-10-CM | POA: Diagnosis not present

## 2021-12-24 DIAGNOSIS — E1159 Type 2 diabetes mellitus with other circulatory complications: Secondary | ICD-10-CM | POA: Diagnosis not present

## 2021-12-24 DIAGNOSIS — R69 Illness, unspecified: Secondary | ICD-10-CM | POA: Diagnosis not present

## 2021-12-24 DIAGNOSIS — G20 Parkinson's disease: Secondary | ICD-10-CM | POA: Diagnosis not present

## 2021-12-24 DIAGNOSIS — I2510 Atherosclerotic heart disease of native coronary artery without angina pectoris: Secondary | ICD-10-CM | POA: Diagnosis not present

## 2021-12-24 DIAGNOSIS — J45909 Unspecified asthma, uncomplicated: Secondary | ICD-10-CM | POA: Diagnosis not present

## 2021-12-25 DIAGNOSIS — F419 Anxiety disorder, unspecified: Secondary | ICD-10-CM | POA: Diagnosis not present

## 2021-12-25 DIAGNOSIS — G3183 Dementia with Lewy bodies: Secondary | ICD-10-CM | POA: Diagnosis not present

## 2021-12-25 DIAGNOSIS — G20 Parkinson's disease: Secondary | ICD-10-CM | POA: Diagnosis not present

## 2021-12-25 DIAGNOSIS — R69 Illness, unspecified: Secondary | ICD-10-CM | POA: Diagnosis not present

## 2022-01-01 DIAGNOSIS — H259 Unspecified age-related cataract: Secondary | ICD-10-CM | POA: Diagnosis not present

## 2022-01-01 DIAGNOSIS — E119 Type 2 diabetes mellitus without complications: Secondary | ICD-10-CM | POA: Diagnosis not present

## 2022-01-01 DIAGNOSIS — H25811 Combined forms of age-related cataract, right eye: Secondary | ICD-10-CM | POA: Diagnosis not present

## 2022-01-01 DIAGNOSIS — E1136 Type 2 diabetes mellitus with diabetic cataract: Secondary | ICD-10-CM | POA: Diagnosis not present

## 2022-01-01 DIAGNOSIS — Z794 Long term (current) use of insulin: Secondary | ICD-10-CM | POA: Diagnosis not present

## 2022-01-01 DIAGNOSIS — Z7982 Long term (current) use of aspirin: Secondary | ICD-10-CM | POA: Diagnosis not present

## 2022-01-10 DIAGNOSIS — H25811 Combined forms of age-related cataract, right eye: Secondary | ICD-10-CM | POA: Diagnosis not present

## 2022-01-10 DIAGNOSIS — Z9841 Cataract extraction status, right eye: Secondary | ICD-10-CM | POA: Diagnosis not present

## 2022-01-10 DIAGNOSIS — Z961 Presence of intraocular lens: Secondary | ICD-10-CM | POA: Diagnosis not present

## 2022-01-16 DIAGNOSIS — F419 Anxiety disorder, unspecified: Secondary | ICD-10-CM | POA: Diagnosis not present

## 2022-01-16 DIAGNOSIS — R69 Illness, unspecified: Secondary | ICD-10-CM | POA: Diagnosis not present

## 2022-01-16 DIAGNOSIS — G3183 Dementia with Lewy bodies: Secondary | ICD-10-CM | POA: Diagnosis not present

## 2022-01-22 DIAGNOSIS — K219 Gastro-esophageal reflux disease without esophagitis: Secondary | ICD-10-CM | POA: Diagnosis not present

## 2022-01-22 DIAGNOSIS — G3183 Dementia with Lewy bodies: Secondary | ICD-10-CM | POA: Diagnosis not present

## 2022-01-22 DIAGNOSIS — I10 Essential (primary) hypertension: Secondary | ICD-10-CM | POA: Diagnosis not present

## 2022-01-22 DIAGNOSIS — R69 Illness, unspecified: Secondary | ICD-10-CM | POA: Diagnosis not present

## 2022-01-22 DIAGNOSIS — J45909 Unspecified asthma, uncomplicated: Secondary | ICD-10-CM | POA: Diagnosis not present

## 2022-01-22 DIAGNOSIS — E1159 Type 2 diabetes mellitus with other circulatory complications: Secondary | ICD-10-CM | POA: Diagnosis not present

## 2022-01-22 DIAGNOSIS — E7800 Pure hypercholesterolemia, unspecified: Secondary | ICD-10-CM | POA: Diagnosis not present

## 2022-01-22 DIAGNOSIS — R110 Nausea: Secondary | ICD-10-CM | POA: Diagnosis not present

## 2022-01-22 DIAGNOSIS — M159 Polyosteoarthritis, unspecified: Secondary | ICD-10-CM | POA: Diagnosis not present

## 2022-01-22 DIAGNOSIS — I2510 Atherosclerotic heart disease of native coronary artery without angina pectoris: Secondary | ICD-10-CM | POA: Diagnosis not present

## 2022-02-08 DIAGNOSIS — R69 Illness, unspecified: Secondary | ICD-10-CM | POA: Diagnosis not present

## 2022-02-08 DIAGNOSIS — G3183 Dementia with Lewy bodies: Secondary | ICD-10-CM | POA: Diagnosis not present

## 2022-02-08 DIAGNOSIS — J45909 Unspecified asthma, uncomplicated: Secondary | ICD-10-CM | POA: Diagnosis not present

## 2022-02-08 DIAGNOSIS — M159 Polyosteoarthritis, unspecified: Secondary | ICD-10-CM | POA: Diagnosis not present

## 2022-02-08 DIAGNOSIS — R110 Nausea: Secondary | ICD-10-CM | POA: Diagnosis not present

## 2022-02-08 DIAGNOSIS — E1159 Type 2 diabetes mellitus with other circulatory complications: Secondary | ICD-10-CM | POA: Diagnosis not present

## 2022-02-08 DIAGNOSIS — K219 Gastro-esophageal reflux disease without esophagitis: Secondary | ICD-10-CM | POA: Diagnosis not present

## 2022-02-08 DIAGNOSIS — R569 Unspecified convulsions: Secondary | ICD-10-CM | POA: Diagnosis not present

## 2022-02-08 DIAGNOSIS — E7800 Pure hypercholesterolemia, unspecified: Secondary | ICD-10-CM | POA: Diagnosis not present

## 2022-02-08 DIAGNOSIS — I2510 Atherosclerotic heart disease of native coronary artery without angina pectoris: Secondary | ICD-10-CM | POA: Diagnosis not present

## 2022-02-13 DIAGNOSIS — F419 Anxiety disorder, unspecified: Secondary | ICD-10-CM | POA: Diagnosis not present

## 2022-02-13 DIAGNOSIS — R69 Illness, unspecified: Secondary | ICD-10-CM | POA: Diagnosis not present

## 2022-02-13 DIAGNOSIS — G3183 Dementia with Lewy bodies: Secondary | ICD-10-CM | POA: Diagnosis not present

## 2022-02-18 DIAGNOSIS — G3183 Dementia with Lewy bodies: Secondary | ICD-10-CM | POA: Diagnosis not present

## 2022-02-18 DIAGNOSIS — I2510 Atherosclerotic heart disease of native coronary artery without angina pectoris: Secondary | ICD-10-CM | POA: Diagnosis not present

## 2022-02-18 DIAGNOSIS — J45909 Unspecified asthma, uncomplicated: Secondary | ICD-10-CM | POA: Diagnosis not present

## 2022-02-18 DIAGNOSIS — K219 Gastro-esophageal reflux disease without esophagitis: Secondary | ICD-10-CM | POA: Diagnosis not present

## 2022-02-18 DIAGNOSIS — M159 Polyosteoarthritis, unspecified: Secondary | ICD-10-CM | POA: Diagnosis not present

## 2022-02-18 DIAGNOSIS — R569 Unspecified convulsions: Secondary | ICD-10-CM | POA: Diagnosis not present

## 2022-02-18 DIAGNOSIS — E7800 Pure hypercholesterolemia, unspecified: Secondary | ICD-10-CM | POA: Diagnosis not present

## 2022-02-18 DIAGNOSIS — R69 Illness, unspecified: Secondary | ICD-10-CM | POA: Diagnosis not present

## 2022-02-18 DIAGNOSIS — R110 Nausea: Secondary | ICD-10-CM | POA: Diagnosis not present

## 2022-02-18 DIAGNOSIS — E1159 Type 2 diabetes mellitus with other circulatory complications: Secondary | ICD-10-CM | POA: Diagnosis not present

## 2022-03-18 DIAGNOSIS — J45909 Unspecified asthma, uncomplicated: Secondary | ICD-10-CM | POA: Diagnosis not present

## 2022-03-18 DIAGNOSIS — M159 Polyosteoarthritis, unspecified: Secondary | ICD-10-CM | POA: Diagnosis not present

## 2022-03-18 DIAGNOSIS — G3183 Dementia with Lewy bodies: Secondary | ICD-10-CM | POA: Diagnosis not present

## 2022-03-18 DIAGNOSIS — I2510 Atherosclerotic heart disease of native coronary artery without angina pectoris: Secondary | ICD-10-CM | POA: Diagnosis not present

## 2022-03-18 DIAGNOSIS — E7800 Pure hypercholesterolemia, unspecified: Secondary | ICD-10-CM | POA: Diagnosis not present

## 2022-03-18 DIAGNOSIS — R69 Illness, unspecified: Secondary | ICD-10-CM | POA: Diagnosis not present

## 2022-03-18 DIAGNOSIS — I10 Essential (primary) hypertension: Secondary | ICD-10-CM | POA: Diagnosis not present

## 2022-03-18 DIAGNOSIS — R110 Nausea: Secondary | ICD-10-CM | POA: Diagnosis not present

## 2022-03-18 DIAGNOSIS — E1159 Type 2 diabetes mellitus with other circulatory complications: Secondary | ICD-10-CM | POA: Diagnosis not present

## 2022-03-18 DIAGNOSIS — K219 Gastro-esophageal reflux disease without esophagitis: Secondary | ICD-10-CM | POA: Diagnosis not present

## 2022-03-20 DIAGNOSIS — F419 Anxiety disorder, unspecified: Secondary | ICD-10-CM | POA: Diagnosis not present

## 2022-03-20 DIAGNOSIS — G3183 Dementia with Lewy bodies: Secondary | ICD-10-CM | POA: Diagnosis not present

## 2022-03-20 DIAGNOSIS — R69 Illness, unspecified: Secondary | ICD-10-CM | POA: Diagnosis not present

## 2022-03-21 DIAGNOSIS — E1159 Type 2 diabetes mellitus with other circulatory complications: Secondary | ICD-10-CM | POA: Diagnosis not present

## 2022-03-21 DIAGNOSIS — J45909 Unspecified asthma, uncomplicated: Secondary | ICD-10-CM | POA: Diagnosis not present

## 2022-03-21 DIAGNOSIS — G3183 Dementia with Lewy bodies: Secondary | ICD-10-CM | POA: Diagnosis not present

## 2022-03-21 DIAGNOSIS — E7800 Pure hypercholesterolemia, unspecified: Secondary | ICD-10-CM | POA: Diagnosis not present

## 2022-03-21 DIAGNOSIS — R110 Nausea: Secondary | ICD-10-CM | POA: Diagnosis not present

## 2022-03-21 DIAGNOSIS — M159 Polyosteoarthritis, unspecified: Secondary | ICD-10-CM | POA: Diagnosis not present

## 2022-03-21 DIAGNOSIS — R69 Illness, unspecified: Secondary | ICD-10-CM | POA: Diagnosis not present

## 2022-03-21 DIAGNOSIS — U071 COVID-19: Secondary | ICD-10-CM | POA: Diagnosis not present

## 2022-03-21 DIAGNOSIS — I2510 Atherosclerotic heart disease of native coronary artery without angina pectoris: Secondary | ICD-10-CM | POA: Diagnosis not present

## 2022-03-21 DIAGNOSIS — I10 Essential (primary) hypertension: Secondary | ICD-10-CM | POA: Diagnosis not present

## 2022-04-13 DIAGNOSIS — Z794 Long term (current) use of insulin: Secondary | ICD-10-CM | POA: Diagnosis not present

## 2022-04-13 DIAGNOSIS — R69 Illness, unspecified: Secondary | ICD-10-CM | POA: Diagnosis not present

## 2022-04-13 DIAGNOSIS — R9431 Abnormal electrocardiogram [ECG] [EKG]: Secondary | ICD-10-CM | POA: Diagnosis not present

## 2022-04-13 DIAGNOSIS — I2510 Atherosclerotic heart disease of native coronary artery without angina pectoris: Secondary | ICD-10-CM | POA: Diagnosis not present

## 2022-04-13 DIAGNOSIS — I249 Acute ischemic heart disease, unspecified: Secondary | ICD-10-CM | POA: Diagnosis not present

## 2022-04-13 DIAGNOSIS — E785 Hyperlipidemia, unspecified: Secondary | ICD-10-CM | POA: Diagnosis not present

## 2022-04-13 DIAGNOSIS — R079 Chest pain, unspecified: Secondary | ICD-10-CM | POA: Diagnosis not present

## 2022-04-13 DIAGNOSIS — F418 Other specified anxiety disorders: Secondary | ICD-10-CM | POA: Diagnosis not present

## 2022-04-13 DIAGNOSIS — E119 Type 2 diabetes mellitus without complications: Secondary | ICD-10-CM | POA: Diagnosis not present

## 2022-04-13 DIAGNOSIS — Z8673 Personal history of transient ischemic attack (TIA), and cerebral infarction without residual deficits: Secondary | ICD-10-CM | POA: Diagnosis not present

## 2022-04-13 DIAGNOSIS — I10 Essential (primary) hypertension: Secondary | ICD-10-CM | POA: Diagnosis not present

## 2022-04-13 DIAGNOSIS — I639 Cerebral infarction, unspecified: Secondary | ICD-10-CM | POA: Diagnosis not present

## 2022-04-13 DIAGNOSIS — Z79899 Other long term (current) drug therapy: Secondary | ICD-10-CM | POA: Diagnosis not present

## 2022-04-13 DIAGNOSIS — G20A1 Parkinson's disease without dyskinesia, without mention of fluctuations: Secondary | ICD-10-CM | POA: Diagnosis not present

## 2022-04-13 DIAGNOSIS — I482 Chronic atrial fibrillation, unspecified: Secondary | ICD-10-CM | POA: Diagnosis not present

## 2022-04-13 DIAGNOSIS — Z743 Need for continuous supervision: Secondary | ICD-10-CM | POA: Diagnosis not present

## 2022-04-13 DIAGNOSIS — Z7982 Long term (current) use of aspirin: Secondary | ICD-10-CM | POA: Diagnosis not present

## 2022-04-13 DIAGNOSIS — R0789 Other chest pain: Secondary | ICD-10-CM | POA: Diagnosis not present

## 2022-04-14 DIAGNOSIS — R079 Chest pain, unspecified: Secondary | ICD-10-CM | POA: Diagnosis not present

## 2022-04-14 DIAGNOSIS — Z8673 Personal history of transient ischemic attack (TIA), and cerebral infarction without residual deficits: Secondary | ICD-10-CM | POA: Diagnosis not present

## 2022-04-14 DIAGNOSIS — I482 Chronic atrial fibrillation, unspecified: Secondary | ICD-10-CM | POA: Diagnosis not present

## 2022-04-15 DIAGNOSIS — R079 Chest pain, unspecified: Secondary | ICD-10-CM | POA: Diagnosis not present

## 2022-04-15 DIAGNOSIS — Z8673 Personal history of transient ischemic attack (TIA), and cerebral infarction without residual deficits: Secondary | ICD-10-CM | POA: Diagnosis not present

## 2022-04-15 DIAGNOSIS — I482 Chronic atrial fibrillation, unspecified: Secondary | ICD-10-CM | POA: Diagnosis not present

## 2022-04-17 DIAGNOSIS — E7800 Pure hypercholesterolemia, unspecified: Secondary | ICD-10-CM | POA: Diagnosis not present

## 2022-04-17 DIAGNOSIS — R69 Illness, unspecified: Secondary | ICD-10-CM | POA: Diagnosis not present

## 2022-04-17 DIAGNOSIS — M159 Polyosteoarthritis, unspecified: Secondary | ICD-10-CM | POA: Diagnosis not present

## 2022-04-17 DIAGNOSIS — G3183 Dementia with Lewy bodies: Secondary | ICD-10-CM | POA: Diagnosis not present

## 2022-04-17 DIAGNOSIS — R110 Nausea: Secondary | ICD-10-CM | POA: Diagnosis not present

## 2022-04-17 DIAGNOSIS — J45909 Unspecified asthma, uncomplicated: Secondary | ICD-10-CM | POA: Diagnosis not present

## 2022-04-17 DIAGNOSIS — I10 Essential (primary) hypertension: Secondary | ICD-10-CM | POA: Diagnosis not present

## 2022-04-17 DIAGNOSIS — F419 Anxiety disorder, unspecified: Secondary | ICD-10-CM | POA: Diagnosis not present

## 2022-04-17 DIAGNOSIS — I2510 Atherosclerotic heart disease of native coronary artery without angina pectoris: Secondary | ICD-10-CM | POA: Diagnosis not present

## 2022-04-17 DIAGNOSIS — E1159 Type 2 diabetes mellitus with other circulatory complications: Secondary | ICD-10-CM | POA: Diagnosis not present

## 2022-04-17 DIAGNOSIS — K219 Gastro-esophageal reflux disease without esophagitis: Secondary | ICD-10-CM | POA: Diagnosis not present

## 2022-05-07 DIAGNOSIS — G3183 Dementia with Lewy bodies: Secondary | ICD-10-CM | POA: Diagnosis not present

## 2022-05-07 DIAGNOSIS — R69 Illness, unspecified: Secondary | ICD-10-CM | POA: Diagnosis not present

## 2022-05-07 DIAGNOSIS — M159 Polyosteoarthritis, unspecified: Secondary | ICD-10-CM | POA: Diagnosis not present

## 2022-05-07 DIAGNOSIS — J45909 Unspecified asthma, uncomplicated: Secondary | ICD-10-CM | POA: Diagnosis not present

## 2022-05-07 DIAGNOSIS — E7800 Pure hypercholesterolemia, unspecified: Secondary | ICD-10-CM | POA: Diagnosis not present

## 2022-05-07 DIAGNOSIS — R110 Nausea: Secondary | ICD-10-CM | POA: Diagnosis not present

## 2022-05-07 DIAGNOSIS — E1159 Type 2 diabetes mellitus with other circulatory complications: Secondary | ICD-10-CM | POA: Diagnosis not present

## 2022-05-07 DIAGNOSIS — I2510 Atherosclerotic heart disease of native coronary artery without angina pectoris: Secondary | ICD-10-CM | POA: Diagnosis not present

## 2022-05-07 DIAGNOSIS — I10 Essential (primary) hypertension: Secondary | ICD-10-CM | POA: Diagnosis not present

## 2022-05-07 DIAGNOSIS — R42 Dizziness and giddiness: Secondary | ICD-10-CM | POA: Diagnosis not present

## 2022-05-14 DIAGNOSIS — J45909 Unspecified asthma, uncomplicated: Secondary | ICD-10-CM | POA: Diagnosis not present

## 2022-05-14 DIAGNOSIS — E1159 Type 2 diabetes mellitus with other circulatory complications: Secondary | ICD-10-CM | POA: Diagnosis not present

## 2022-05-14 DIAGNOSIS — R42 Dizziness and giddiness: Secondary | ICD-10-CM | POA: Diagnosis not present

## 2022-05-14 DIAGNOSIS — R110 Nausea: Secondary | ICD-10-CM | POA: Diagnosis not present

## 2022-05-14 DIAGNOSIS — I2510 Atherosclerotic heart disease of native coronary artery without angina pectoris: Secondary | ICD-10-CM | POA: Diagnosis not present

## 2022-05-14 DIAGNOSIS — R69 Illness, unspecified: Secondary | ICD-10-CM | POA: Diagnosis not present

## 2022-05-14 DIAGNOSIS — I10 Essential (primary) hypertension: Secondary | ICD-10-CM | POA: Diagnosis not present

## 2022-05-14 DIAGNOSIS — E7800 Pure hypercholesterolemia, unspecified: Secondary | ICD-10-CM | POA: Diagnosis not present

## 2022-05-14 DIAGNOSIS — M159 Polyosteoarthritis, unspecified: Secondary | ICD-10-CM | POA: Diagnosis not present

## 2022-05-14 DIAGNOSIS — G3183 Dementia with Lewy bodies: Secondary | ICD-10-CM | POA: Diagnosis not present

## 2022-05-20 ENCOUNTER — Encounter: Payer: Self-pay | Admitting: Adult Health

## 2022-05-20 ENCOUNTER — Ambulatory Visit: Payer: Medicare HMO | Admitting: Adult Health

## 2022-06-17 DIAGNOSIS — E1159 Type 2 diabetes mellitus with other circulatory complications: Secondary | ICD-10-CM | POA: Diagnosis not present

## 2022-06-17 DIAGNOSIS — I2510 Atherosclerotic heart disease of native coronary artery without angina pectoris: Secondary | ICD-10-CM | POA: Diagnosis not present

## 2022-06-17 DIAGNOSIS — G3183 Dementia with Lewy bodies: Secondary | ICD-10-CM | POA: Diagnosis not present

## 2022-06-17 DIAGNOSIS — R110 Nausea: Secondary | ICD-10-CM | POA: Diagnosis not present

## 2022-06-17 DIAGNOSIS — R42 Dizziness and giddiness: Secondary | ICD-10-CM | POA: Diagnosis not present

## 2022-06-17 DIAGNOSIS — E7800 Pure hypercholesterolemia, unspecified: Secondary | ICD-10-CM | POA: Diagnosis not present

## 2022-06-17 DIAGNOSIS — J45909 Unspecified asthma, uncomplicated: Secondary | ICD-10-CM | POA: Diagnosis not present

## 2022-06-17 DIAGNOSIS — I10 Essential (primary) hypertension: Secondary | ICD-10-CM | POA: Diagnosis not present

## 2022-06-17 DIAGNOSIS — M159 Polyosteoarthritis, unspecified: Secondary | ICD-10-CM | POA: Diagnosis not present

## 2022-06-17 DIAGNOSIS — R69 Illness, unspecified: Secondary | ICD-10-CM | POA: Diagnosis not present

## 2022-06-19 DIAGNOSIS — F419 Anxiety disorder, unspecified: Secondary | ICD-10-CM | POA: Diagnosis not present

## 2022-06-19 DIAGNOSIS — G3183 Dementia with Lewy bodies: Secondary | ICD-10-CM | POA: Diagnosis not present

## 2022-06-19 DIAGNOSIS — R69 Illness, unspecified: Secondary | ICD-10-CM | POA: Diagnosis not present

## 2022-07-15 DIAGNOSIS — G3183 Dementia with Lewy bodies: Secondary | ICD-10-CM | POA: Diagnosis not present

## 2022-07-15 DIAGNOSIS — J45909 Unspecified asthma, uncomplicated: Secondary | ICD-10-CM | POA: Diagnosis not present

## 2022-07-15 DIAGNOSIS — R110 Nausea: Secondary | ICD-10-CM | POA: Diagnosis not present

## 2022-07-15 DIAGNOSIS — E7800 Pure hypercholesterolemia, unspecified: Secondary | ICD-10-CM | POA: Diagnosis not present

## 2022-07-15 DIAGNOSIS — M159 Polyosteoarthritis, unspecified: Secondary | ICD-10-CM | POA: Diagnosis not present

## 2022-07-15 DIAGNOSIS — R42 Dizziness and giddiness: Secondary | ICD-10-CM | POA: Diagnosis not present

## 2022-07-15 DIAGNOSIS — I10 Essential (primary) hypertension: Secondary | ICD-10-CM | POA: Diagnosis not present

## 2022-07-15 DIAGNOSIS — I2510 Atherosclerotic heart disease of native coronary artery without angina pectoris: Secondary | ICD-10-CM | POA: Diagnosis not present

## 2022-07-15 DIAGNOSIS — R69 Illness, unspecified: Secondary | ICD-10-CM | POA: Diagnosis not present

## 2022-07-15 DIAGNOSIS — E1159 Type 2 diabetes mellitus with other circulatory complications: Secondary | ICD-10-CM | POA: Diagnosis not present

## 2022-08-12 DIAGNOSIS — J45909 Unspecified asthma, uncomplicated: Secondary | ICD-10-CM | POA: Diagnosis not present

## 2022-08-12 DIAGNOSIS — G3183 Dementia with Lewy bodies: Secondary | ICD-10-CM | POA: Diagnosis not present

## 2022-08-12 DIAGNOSIS — R42 Dizziness and giddiness: Secondary | ICD-10-CM | POA: Diagnosis not present

## 2022-08-12 DIAGNOSIS — E1159 Type 2 diabetes mellitus with other circulatory complications: Secondary | ICD-10-CM | POA: Diagnosis not present

## 2022-08-12 DIAGNOSIS — M159 Polyosteoarthritis, unspecified: Secondary | ICD-10-CM | POA: Diagnosis not present

## 2022-08-12 DIAGNOSIS — F0280 Dementia in other diseases classified elsewhere without behavioral disturbance: Secondary | ICD-10-CM | POA: Diagnosis not present

## 2022-08-12 DIAGNOSIS — F419 Anxiety disorder, unspecified: Secondary | ICD-10-CM | POA: Diagnosis not present

## 2022-08-12 DIAGNOSIS — F319 Bipolar disorder, unspecified: Secondary | ICD-10-CM | POA: Diagnosis not present

## 2022-08-12 DIAGNOSIS — E7800 Pure hypercholesterolemia, unspecified: Secondary | ICD-10-CM | POA: Diagnosis not present

## 2022-08-12 DIAGNOSIS — I2510 Atherosclerotic heart disease of native coronary artery without angina pectoris: Secondary | ICD-10-CM | POA: Diagnosis not present

## 2022-08-12 DIAGNOSIS — R110 Nausea: Secondary | ICD-10-CM | POA: Diagnosis not present

## 2022-08-12 DIAGNOSIS — I10 Essential (primary) hypertension: Secondary | ICD-10-CM | POA: Diagnosis not present

## 2022-08-19 DIAGNOSIS — F419 Anxiety disorder, unspecified: Secondary | ICD-10-CM | POA: Diagnosis not present

## 2022-08-19 DIAGNOSIS — G3183 Dementia with Lewy bodies: Secondary | ICD-10-CM | POA: Diagnosis not present

## 2022-09-09 DIAGNOSIS — R42 Dizziness and giddiness: Secondary | ICD-10-CM | POA: Diagnosis not present

## 2022-09-09 DIAGNOSIS — I10 Essential (primary) hypertension: Secondary | ICD-10-CM | POA: Diagnosis not present

## 2022-09-09 DIAGNOSIS — E7800 Pure hypercholesterolemia, unspecified: Secondary | ICD-10-CM | POA: Diagnosis not present

## 2022-09-09 DIAGNOSIS — J45909 Unspecified asthma, uncomplicated: Secondary | ICD-10-CM | POA: Diagnosis not present

## 2022-09-09 DIAGNOSIS — F419 Anxiety disorder, unspecified: Secondary | ICD-10-CM | POA: Diagnosis not present

## 2022-09-09 DIAGNOSIS — I2510 Atherosclerotic heart disease of native coronary artery without angina pectoris: Secondary | ICD-10-CM | POA: Diagnosis not present

## 2022-09-09 DIAGNOSIS — F319 Bipolar disorder, unspecified: Secondary | ICD-10-CM | POA: Diagnosis not present

## 2022-09-09 DIAGNOSIS — F0280 Dementia in other diseases classified elsewhere without behavioral disturbance: Secondary | ICD-10-CM | POA: Diagnosis not present

## 2022-09-09 DIAGNOSIS — M159 Polyosteoarthritis, unspecified: Secondary | ICD-10-CM | POA: Diagnosis not present

## 2022-09-09 DIAGNOSIS — G3183 Dementia with Lewy bodies: Secondary | ICD-10-CM | POA: Diagnosis not present

## 2022-09-09 DIAGNOSIS — R110 Nausea: Secondary | ICD-10-CM | POA: Diagnosis not present

## 2022-09-09 DIAGNOSIS — E1159 Type 2 diabetes mellitus with other circulatory complications: Secondary | ICD-10-CM | POA: Diagnosis not present

## 2022-10-07 DIAGNOSIS — E1159 Type 2 diabetes mellitus with other circulatory complications: Secondary | ICD-10-CM | POA: Diagnosis not present

## 2022-10-07 DIAGNOSIS — R42 Dizziness and giddiness: Secondary | ICD-10-CM | POA: Diagnosis not present

## 2022-10-07 DIAGNOSIS — I10 Essential (primary) hypertension: Secondary | ICD-10-CM | POA: Diagnosis not present

## 2022-10-07 DIAGNOSIS — M159 Polyosteoarthritis, unspecified: Secondary | ICD-10-CM | POA: Diagnosis not present

## 2022-10-07 DIAGNOSIS — R110 Nausea: Secondary | ICD-10-CM | POA: Diagnosis not present

## 2022-10-07 DIAGNOSIS — G3183 Dementia with Lewy bodies: Secondary | ICD-10-CM | POA: Diagnosis not present

## 2022-10-07 DIAGNOSIS — F0280 Dementia in other diseases classified elsewhere without behavioral disturbance: Secondary | ICD-10-CM | POA: Diagnosis not present

## 2022-10-07 DIAGNOSIS — E7800 Pure hypercholesterolemia, unspecified: Secondary | ICD-10-CM | POA: Diagnosis not present

## 2022-10-07 DIAGNOSIS — F319 Bipolar disorder, unspecified: Secondary | ICD-10-CM | POA: Diagnosis not present

## 2022-10-07 DIAGNOSIS — F419 Anxiety disorder, unspecified: Secondary | ICD-10-CM | POA: Diagnosis not present

## 2022-10-11 DIAGNOSIS — E1159 Type 2 diabetes mellitus with other circulatory complications: Secondary | ICD-10-CM | POA: Diagnosis not present

## 2022-10-11 DIAGNOSIS — F419 Anxiety disorder, unspecified: Secondary | ICD-10-CM | POA: Diagnosis not present

## 2022-10-11 DIAGNOSIS — I10 Essential (primary) hypertension: Secondary | ICD-10-CM | POA: Diagnosis not present

## 2022-10-11 DIAGNOSIS — F319 Bipolar disorder, unspecified: Secondary | ICD-10-CM | POA: Diagnosis not present

## 2022-10-11 DIAGNOSIS — M159 Polyosteoarthritis, unspecified: Secondary | ICD-10-CM | POA: Diagnosis not present

## 2022-10-11 DIAGNOSIS — E7800 Pure hypercholesterolemia, unspecified: Secondary | ICD-10-CM | POA: Diagnosis not present

## 2022-10-11 DIAGNOSIS — R110 Nausea: Secondary | ICD-10-CM | POA: Diagnosis not present

## 2022-10-11 DIAGNOSIS — G3183 Dementia with Lewy bodies: Secondary | ICD-10-CM | POA: Diagnosis not present

## 2022-10-11 DIAGNOSIS — R42 Dizziness and giddiness: Secondary | ICD-10-CM | POA: Diagnosis not present

## 2022-10-11 DIAGNOSIS — F0280 Dementia in other diseases classified elsewhere without behavioral disturbance: Secondary | ICD-10-CM | POA: Diagnosis not present

## 2022-10-23 DIAGNOSIS — F419 Anxiety disorder, unspecified: Secondary | ICD-10-CM | POA: Diagnosis not present

## 2022-10-23 DIAGNOSIS — G3183 Dementia with Lewy bodies: Secondary | ICD-10-CM | POA: Diagnosis not present

## 2022-11-04 DIAGNOSIS — F0280 Dementia in other diseases classified elsewhere without behavioral disturbance: Secondary | ICD-10-CM | POA: Diagnosis not present

## 2022-11-04 DIAGNOSIS — F319 Bipolar disorder, unspecified: Secondary | ICD-10-CM | POA: Diagnosis not present

## 2022-11-04 DIAGNOSIS — F419 Anxiety disorder, unspecified: Secondary | ICD-10-CM | POA: Diagnosis not present

## 2022-11-04 DIAGNOSIS — I2510 Atherosclerotic heart disease of native coronary artery without angina pectoris: Secondary | ICD-10-CM | POA: Diagnosis not present

## 2022-11-04 DIAGNOSIS — R110 Nausea: Secondary | ICD-10-CM | POA: Diagnosis not present

## 2022-11-04 DIAGNOSIS — E1159 Type 2 diabetes mellitus with other circulatory complications: Secondary | ICD-10-CM | POA: Diagnosis not present

## 2022-11-04 DIAGNOSIS — G3183 Dementia with Lewy bodies: Secondary | ICD-10-CM | POA: Diagnosis not present

## 2022-11-04 DIAGNOSIS — M159 Polyosteoarthritis, unspecified: Secondary | ICD-10-CM | POA: Diagnosis not present

## 2022-11-04 DIAGNOSIS — J45909 Unspecified asthma, uncomplicated: Secondary | ICD-10-CM | POA: Diagnosis not present

## 2022-11-04 DIAGNOSIS — I10 Essential (primary) hypertension: Secondary | ICD-10-CM | POA: Diagnosis not present

## 2022-11-04 DIAGNOSIS — E7800 Pure hypercholesterolemia, unspecified: Secondary | ICD-10-CM | POA: Diagnosis not present

## 2022-11-04 DIAGNOSIS — R42 Dizziness and giddiness: Secondary | ICD-10-CM | POA: Diagnosis not present

## 2022-11-27 DIAGNOSIS — Z0100 Encounter for examination of eyes and vision without abnormal findings: Secondary | ICD-10-CM | POA: Diagnosis not present

## 2022-11-27 DIAGNOSIS — E119 Type 2 diabetes mellitus without complications: Secondary | ICD-10-CM | POA: Diagnosis not present

## 2022-11-27 DIAGNOSIS — H5203 Hypermetropia, bilateral: Secondary | ICD-10-CM | POA: Diagnosis not present

## 2022-11-27 DIAGNOSIS — H524 Presbyopia: Secondary | ICD-10-CM | POA: Diagnosis not present

## 2022-11-27 DIAGNOSIS — H52223 Regular astigmatism, bilateral: Secondary | ICD-10-CM | POA: Diagnosis not present

## 2022-11-27 DIAGNOSIS — Z961 Presence of intraocular lens: Secondary | ICD-10-CM | POA: Diagnosis not present

## 2022-11-27 DIAGNOSIS — H26493 Other secondary cataract, bilateral: Secondary | ICD-10-CM | POA: Diagnosis not present

## 2022-11-27 DIAGNOSIS — Z794 Long term (current) use of insulin: Secondary | ICD-10-CM | POA: Diagnosis not present

## 2022-12-02 DIAGNOSIS — E7800 Pure hypercholesterolemia, unspecified: Secondary | ICD-10-CM | POA: Diagnosis not present

## 2022-12-02 DIAGNOSIS — E1159 Type 2 diabetes mellitus with other circulatory complications: Secondary | ICD-10-CM | POA: Diagnosis not present

## 2022-12-02 DIAGNOSIS — F419 Anxiety disorder, unspecified: Secondary | ICD-10-CM | POA: Diagnosis not present

## 2022-12-02 DIAGNOSIS — R42 Dizziness and giddiness: Secondary | ICD-10-CM | POA: Diagnosis not present

## 2022-12-02 DIAGNOSIS — I2510 Atherosclerotic heart disease of native coronary artery without angina pectoris: Secondary | ICD-10-CM | POA: Diagnosis not present

## 2022-12-02 DIAGNOSIS — I10 Essential (primary) hypertension: Secondary | ICD-10-CM | POA: Diagnosis not present

## 2022-12-02 DIAGNOSIS — M159 Polyosteoarthritis, unspecified: Secondary | ICD-10-CM | POA: Diagnosis not present

## 2022-12-02 DIAGNOSIS — F319 Bipolar disorder, unspecified: Secondary | ICD-10-CM | POA: Diagnosis not present

## 2022-12-02 DIAGNOSIS — F0280 Dementia in other diseases classified elsewhere without behavioral disturbance: Secondary | ICD-10-CM | POA: Diagnosis not present

## 2022-12-02 DIAGNOSIS — G3183 Dementia with Lewy bodies: Secondary | ICD-10-CM | POA: Diagnosis not present

## 2022-12-02 DIAGNOSIS — R110 Nausea: Secondary | ICD-10-CM | POA: Diagnosis not present

## 2022-12-02 DIAGNOSIS — J45909 Unspecified asthma, uncomplicated: Secondary | ICD-10-CM | POA: Diagnosis not present

## 2022-12-09 DIAGNOSIS — G3183 Dementia with Lewy bodies: Secondary | ICD-10-CM | POA: Diagnosis not present

## 2022-12-09 DIAGNOSIS — F419 Anxiety disorder, unspecified: Secondary | ICD-10-CM | POA: Diagnosis not present

## 2022-12-10 DIAGNOSIS — F319 Bipolar disorder, unspecified: Secondary | ICD-10-CM | POA: Diagnosis not present

## 2022-12-10 DIAGNOSIS — R110 Nausea: Secondary | ICD-10-CM | POA: Diagnosis not present

## 2022-12-10 DIAGNOSIS — M159 Polyosteoarthritis, unspecified: Secondary | ICD-10-CM | POA: Diagnosis not present

## 2022-12-10 DIAGNOSIS — M25511 Pain in right shoulder: Secondary | ICD-10-CM | POA: Diagnosis not present

## 2022-12-10 DIAGNOSIS — I10 Essential (primary) hypertension: Secondary | ICD-10-CM | POA: Diagnosis not present

## 2022-12-10 DIAGNOSIS — G3183 Dementia with Lewy bodies: Secondary | ICD-10-CM | POA: Diagnosis not present

## 2022-12-10 DIAGNOSIS — R42 Dizziness and giddiness: Secondary | ICD-10-CM | POA: Diagnosis not present

## 2022-12-10 DIAGNOSIS — E7800 Pure hypercholesterolemia, unspecified: Secondary | ICD-10-CM | POA: Diagnosis not present

## 2022-12-10 DIAGNOSIS — F419 Anxiety disorder, unspecified: Secondary | ICD-10-CM | POA: Diagnosis not present

## 2022-12-10 DIAGNOSIS — E1159 Type 2 diabetes mellitus with other circulatory complications: Secondary | ICD-10-CM | POA: Diagnosis not present

## 2022-12-10 DIAGNOSIS — I2089 Other forms of angina pectoris: Secondary | ICD-10-CM | POA: Diagnosis not present

## 2022-12-10 DIAGNOSIS — F0280 Dementia in other diseases classified elsewhere without behavioral disturbance: Secondary | ICD-10-CM | POA: Diagnosis not present

## 2023-01-14 DIAGNOSIS — J45909 Unspecified asthma, uncomplicated: Secondary | ICD-10-CM | POA: Diagnosis not present

## 2023-01-14 DIAGNOSIS — I2510 Atherosclerotic heart disease of native coronary artery without angina pectoris: Secondary | ICD-10-CM | POA: Diagnosis not present

## 2023-01-14 DIAGNOSIS — E1159 Type 2 diabetes mellitus with other circulatory complications: Secondary | ICD-10-CM | POA: Diagnosis not present

## 2023-01-14 DIAGNOSIS — E7800 Pure hypercholesterolemia, unspecified: Secondary | ICD-10-CM | POA: Diagnosis not present

## 2023-01-14 DIAGNOSIS — I10 Essential (primary) hypertension: Secondary | ICD-10-CM | POA: Diagnosis not present

## 2023-01-14 DIAGNOSIS — G3183 Dementia with Lewy bodies: Secondary | ICD-10-CM | POA: Diagnosis not present

## 2023-01-14 DIAGNOSIS — F0280 Dementia in other diseases classified elsewhere without behavioral disturbance: Secondary | ICD-10-CM | POA: Diagnosis not present

## 2023-01-14 DIAGNOSIS — F4381 Prolonged grief disorder: Secondary | ICD-10-CM | POA: Diagnosis not present

## 2023-01-14 DIAGNOSIS — F419 Anxiety disorder, unspecified: Secondary | ICD-10-CM | POA: Diagnosis not present

## 2023-01-14 DIAGNOSIS — R42 Dizziness and giddiness: Secondary | ICD-10-CM | POA: Diagnosis not present

## 2023-01-14 DIAGNOSIS — M159 Polyosteoarthritis, unspecified: Secondary | ICD-10-CM | POA: Diagnosis not present

## 2023-01-14 DIAGNOSIS — F319 Bipolar disorder, unspecified: Secondary | ICD-10-CM | POA: Diagnosis not present

## 2023-01-14 DIAGNOSIS — R110 Nausea: Secondary | ICD-10-CM | POA: Diagnosis not present

## 2023-02-05 DIAGNOSIS — F419 Anxiety disorder, unspecified: Secondary | ICD-10-CM | POA: Diagnosis not present

## 2023-02-05 DIAGNOSIS — F4381 Prolonged grief disorder: Secondary | ICD-10-CM | POA: Diagnosis not present

## 2023-02-05 DIAGNOSIS — G3183 Dementia with Lewy bodies: Secondary | ICD-10-CM | POA: Diagnosis not present

## 2023-02-11 DIAGNOSIS — F319 Bipolar disorder, unspecified: Secondary | ICD-10-CM | POA: Diagnosis not present

## 2023-02-11 DIAGNOSIS — R42 Dizziness and giddiness: Secondary | ICD-10-CM | POA: Diagnosis not present

## 2023-02-11 DIAGNOSIS — J45909 Unspecified asthma, uncomplicated: Secondary | ICD-10-CM | POA: Diagnosis not present

## 2023-02-11 DIAGNOSIS — F5104 Psychophysiologic insomnia: Secondary | ICD-10-CM | POA: Diagnosis not present

## 2023-02-11 DIAGNOSIS — M159 Polyosteoarthritis, unspecified: Secondary | ICD-10-CM | POA: Diagnosis not present

## 2023-02-11 DIAGNOSIS — E1159 Type 2 diabetes mellitus with other circulatory complications: Secondary | ICD-10-CM | POA: Diagnosis not present

## 2023-02-11 DIAGNOSIS — E7800 Pure hypercholesterolemia, unspecified: Secondary | ICD-10-CM | POA: Diagnosis not present

## 2023-02-11 DIAGNOSIS — I10 Essential (primary) hypertension: Secondary | ICD-10-CM | POA: Diagnosis not present

## 2023-02-11 DIAGNOSIS — F0280 Dementia in other diseases classified elsewhere without behavioral disturbance: Secondary | ICD-10-CM | POA: Diagnosis not present

## 2023-02-11 DIAGNOSIS — R110 Nausea: Secondary | ICD-10-CM | POA: Diagnosis not present

## 2023-02-11 DIAGNOSIS — F419 Anxiety disorder, unspecified: Secondary | ICD-10-CM | POA: Diagnosis not present

## 2023-02-11 DIAGNOSIS — G3183 Dementia with Lewy bodies: Secondary | ICD-10-CM | POA: Diagnosis not present

## 2023-02-20 DIAGNOSIS — F419 Anxiety disorder, unspecified: Secondary | ICD-10-CM | POA: Diagnosis not present

## 2023-02-20 DIAGNOSIS — F4381 Prolonged grief disorder: Secondary | ICD-10-CM | POA: Diagnosis not present

## 2023-02-20 DIAGNOSIS — G3183 Dementia with Lewy bodies: Secondary | ICD-10-CM | POA: Diagnosis not present

## 2023-02-26 DIAGNOSIS — E7800 Pure hypercholesterolemia, unspecified: Secondary | ICD-10-CM | POA: Diagnosis not present

## 2023-02-26 DIAGNOSIS — F319 Bipolar disorder, unspecified: Secondary | ICD-10-CM | POA: Diagnosis not present

## 2023-02-26 DIAGNOSIS — R42 Dizziness and giddiness: Secondary | ICD-10-CM | POA: Diagnosis not present

## 2023-02-26 DIAGNOSIS — R110 Nausea: Secondary | ICD-10-CM | POA: Diagnosis not present

## 2023-02-26 DIAGNOSIS — I10 Essential (primary) hypertension: Secondary | ICD-10-CM | POA: Diagnosis not present

## 2023-02-26 DIAGNOSIS — F5104 Psychophysiologic insomnia: Secondary | ICD-10-CM | POA: Diagnosis not present

## 2023-02-26 DIAGNOSIS — M159 Polyosteoarthritis, unspecified: Secondary | ICD-10-CM | POA: Diagnosis not present

## 2023-02-26 DIAGNOSIS — E1159 Type 2 diabetes mellitus with other circulatory complications: Secondary | ICD-10-CM | POA: Diagnosis not present

## 2023-02-26 DIAGNOSIS — F0280 Dementia in other diseases classified elsewhere without behavioral disturbance: Secondary | ICD-10-CM | POA: Diagnosis not present

## 2023-02-26 DIAGNOSIS — G3183 Dementia with Lewy bodies: Secondary | ICD-10-CM | POA: Diagnosis not present

## 2023-02-26 DIAGNOSIS — J45909 Unspecified asthma, uncomplicated: Secondary | ICD-10-CM | POA: Diagnosis not present

## 2023-02-26 DIAGNOSIS — F419 Anxiety disorder, unspecified: Secondary | ICD-10-CM | POA: Diagnosis not present

## 2023-03-18 DIAGNOSIS — G3183 Dementia with Lewy bodies: Secondary | ICD-10-CM | POA: Diagnosis not present

## 2023-03-18 DIAGNOSIS — F419 Anxiety disorder, unspecified: Secondary | ICD-10-CM | POA: Diagnosis not present

## 2023-03-18 DIAGNOSIS — F4381 Prolonged grief disorder: Secondary | ICD-10-CM | POA: Diagnosis not present

## 2023-04-14 DIAGNOSIS — E7800 Pure hypercholesterolemia, unspecified: Secondary | ICD-10-CM | POA: Diagnosis not present

## 2023-04-14 DIAGNOSIS — R42 Dizziness and giddiness: Secondary | ICD-10-CM | POA: Diagnosis not present

## 2023-04-14 DIAGNOSIS — E1159 Type 2 diabetes mellitus with other circulatory complications: Secondary | ICD-10-CM | POA: Diagnosis not present

## 2023-04-14 DIAGNOSIS — J111 Influenza due to unidentified influenza virus with other respiratory manifestations: Secondary | ICD-10-CM | POA: Diagnosis not present

## 2023-04-14 DIAGNOSIS — R110 Nausea: Secondary | ICD-10-CM | POA: Diagnosis not present

## 2023-04-14 DIAGNOSIS — F319 Bipolar disorder, unspecified: Secondary | ICD-10-CM | POA: Diagnosis not present

## 2023-04-14 DIAGNOSIS — F5104 Psychophysiologic insomnia: Secondary | ICD-10-CM | POA: Diagnosis not present

## 2023-04-14 DIAGNOSIS — I10 Essential (primary) hypertension: Secondary | ICD-10-CM | POA: Diagnosis not present

## 2023-04-14 DIAGNOSIS — F419 Anxiety disorder, unspecified: Secondary | ICD-10-CM | POA: Diagnosis not present

## 2023-04-14 DIAGNOSIS — G3183 Dementia with Lewy bodies: Secondary | ICD-10-CM | POA: Diagnosis not present

## 2023-04-14 DIAGNOSIS — F0280 Dementia in other diseases classified elsewhere without behavioral disturbance: Secondary | ICD-10-CM | POA: Diagnosis not present

## 2023-04-14 DIAGNOSIS — M159 Polyosteoarthritis, unspecified: Secondary | ICD-10-CM | POA: Diagnosis not present

## 2023-04-23 DIAGNOSIS — F419 Anxiety disorder, unspecified: Secondary | ICD-10-CM | POA: Diagnosis not present

## 2023-04-23 DIAGNOSIS — F5104 Psychophysiologic insomnia: Secondary | ICD-10-CM | POA: Diagnosis not present

## 2023-04-23 DIAGNOSIS — R112 Nausea with vomiting, unspecified: Secondary | ICD-10-CM | POA: Diagnosis not present

## 2023-04-23 DIAGNOSIS — E7800 Pure hypercholesterolemia, unspecified: Secondary | ICD-10-CM | POA: Diagnosis not present

## 2023-04-23 DIAGNOSIS — I10 Essential (primary) hypertension: Secondary | ICD-10-CM | POA: Diagnosis not present

## 2023-04-23 DIAGNOSIS — F319 Bipolar disorder, unspecified: Secondary | ICD-10-CM | POA: Diagnosis not present

## 2023-04-23 DIAGNOSIS — F0280 Dementia in other diseases classified elsewhere without behavioral disturbance: Secondary | ICD-10-CM | POA: Diagnosis not present

## 2023-04-23 DIAGNOSIS — R42 Dizziness and giddiness: Secondary | ICD-10-CM | POA: Diagnosis not present

## 2023-04-23 DIAGNOSIS — G3183 Dementia with Lewy bodies: Secondary | ICD-10-CM | POA: Diagnosis not present

## 2023-04-23 DIAGNOSIS — R110 Nausea: Secondary | ICD-10-CM | POA: Diagnosis not present

## 2023-04-23 DIAGNOSIS — E1159 Type 2 diabetes mellitus with other circulatory complications: Secondary | ICD-10-CM | POA: Diagnosis not present

## 2023-04-23 DIAGNOSIS — M159 Polyosteoarthritis, unspecified: Secondary | ICD-10-CM | POA: Diagnosis not present

## 2023-05-02 DIAGNOSIS — Z1152 Encounter for screening for COVID-19: Secondary | ICD-10-CM | POA: Diagnosis not present

## 2023-05-02 DIAGNOSIS — R110 Nausea: Secondary | ICD-10-CM | POA: Diagnosis not present

## 2023-05-02 DIAGNOSIS — G3183 Dementia with Lewy bodies: Secondary | ICD-10-CM | POA: Diagnosis not present

## 2023-05-02 DIAGNOSIS — F419 Anxiety disorder, unspecified: Secondary | ICD-10-CM | POA: Diagnosis not present

## 2023-05-02 DIAGNOSIS — J449 Chronic obstructive pulmonary disease, unspecified: Secondary | ICD-10-CM | POA: Diagnosis not present

## 2023-05-02 DIAGNOSIS — Z743 Need for continuous supervision: Secondary | ICD-10-CM | POA: Diagnosis not present

## 2023-05-02 DIAGNOSIS — R112 Nausea with vomiting, unspecified: Secondary | ICD-10-CM | POA: Diagnosis not present

## 2023-05-02 DIAGNOSIS — E119 Type 2 diabetes mellitus without complications: Secondary | ICD-10-CM | POA: Diagnosis not present

## 2023-05-02 DIAGNOSIS — R1111 Vomiting without nausea: Secondary | ICD-10-CM | POA: Diagnosis not present

## 2023-05-02 DIAGNOSIS — F4381 Prolonged grief disorder: Secondary | ICD-10-CM | POA: Diagnosis not present

## 2023-06-03 DIAGNOSIS — G3183 Dementia with Lewy bodies: Secondary | ICD-10-CM | POA: Diagnosis not present

## 2023-06-03 DIAGNOSIS — F419 Anxiety disorder, unspecified: Secondary | ICD-10-CM | POA: Diagnosis not present

## 2023-06-03 DIAGNOSIS — F4381 Prolonged grief disorder: Secondary | ICD-10-CM | POA: Diagnosis not present

## 2023-06-16 ENCOUNTER — Telehealth: Payer: Self-pay | Admitting: Neurology

## 2023-06-16 DIAGNOSIS — R9431 Abnormal electrocardiogram [ECG] [EKG]: Secondary | ICD-10-CM | POA: Diagnosis not present

## 2023-06-16 DIAGNOSIS — Z8673 Personal history of transient ischemic attack (TIA), and cerebral infarction without residual deficits: Secondary | ICD-10-CM | POA: Diagnosis not present

## 2023-06-16 DIAGNOSIS — I4891 Unspecified atrial fibrillation: Secondary | ICD-10-CM | POA: Diagnosis not present

## 2023-06-16 DIAGNOSIS — R739 Hyperglycemia, unspecified: Secondary | ICD-10-CM | POA: Diagnosis not present

## 2023-06-16 DIAGNOSIS — R42 Dizziness and giddiness: Secondary | ICD-10-CM | POA: Diagnosis not present

## 2023-06-16 DIAGNOSIS — R609 Edema, unspecified: Secondary | ICD-10-CM | POA: Diagnosis not present

## 2023-06-16 DIAGNOSIS — G4489 Other headache syndrome: Secondary | ICD-10-CM | POA: Diagnosis not present

## 2023-06-16 DIAGNOSIS — Z794 Long term (current) use of insulin: Secondary | ICD-10-CM | POA: Diagnosis not present

## 2023-06-16 NOTE — Telephone Encounter (Signed)
 We haven't seen the patient in 2 years so we will need an appointment.

## 2023-06-16 NOTE — Telephone Encounter (Signed)
 Patient's granddaughter called and stated she thinks meds for tremors needs to be increased. Stated tremor is worsening. Would like a call back to discuss [phone] Gina Lozano

## 2023-06-16 NOTE — Telephone Encounter (Signed)
 I spoke with the patient's granddaughter Victorino Dike.  She states the patient overall had been doing well and she has been living at [facility] in [place].  She states she picks up the patient for 2 days on a weekly basis to visit, grocery shop, and go to doctor appointments.  She states overall there has been no change in her memory but she has noticed a slight worsening of tremor in the last 3 months.  She would like to schedule a follow-up appointment as she questions if Dr. Lucia Gaskins would want to increase the patient's tremor medication dose.  She states the patient recently lost a sister and a son within the last few months.  She is unsure the direct correlation to the worsening tremor but would like her to be seen.  Monday works best for them.  I was able to schedule with Dr. Lucia Gaskins on Monday, May 20 at 9:30 AM arrival 9:00.  She was very Adult nurse.

## 2023-06-17 DIAGNOSIS — E1140 Type 2 diabetes mellitus with diabetic neuropathy, unspecified: Secondary | ICD-10-CM | POA: Diagnosis not present

## 2023-06-17 DIAGNOSIS — Z794 Long term (current) use of insulin: Secondary | ICD-10-CM | POA: Diagnosis not present

## 2023-06-17 DIAGNOSIS — R42 Dizziness and giddiness: Secondary | ICD-10-CM | POA: Diagnosis not present

## 2023-06-27 DIAGNOSIS — G3183 Dementia with Lewy bodies: Secondary | ICD-10-CM | POA: Diagnosis not present

## 2023-06-27 DIAGNOSIS — F419 Anxiety disorder, unspecified: Secondary | ICD-10-CM | POA: Diagnosis not present

## 2023-06-27 DIAGNOSIS — F4381 Prolonged grief disorder: Secondary | ICD-10-CM | POA: Diagnosis not present

## 2023-06-27 DIAGNOSIS — F5105 Insomnia due to other mental disorder: Secondary | ICD-10-CM | POA: Diagnosis not present

## 2023-07-15 DIAGNOSIS — M25512 Pain in left shoulder: Secondary | ICD-10-CM | POA: Diagnosis not present

## 2023-07-15 DIAGNOSIS — F319 Bipolar disorder, unspecified: Secondary | ICD-10-CM | POA: Diagnosis not present

## 2023-07-15 DIAGNOSIS — F419 Anxiety disorder, unspecified: Secondary | ICD-10-CM | POA: Diagnosis not present

## 2023-07-15 DIAGNOSIS — F5104 Psychophysiologic insomnia: Secondary | ICD-10-CM | POA: Diagnosis not present

## 2023-07-15 DIAGNOSIS — G3183 Dementia with Lewy bodies: Secondary | ICD-10-CM | POA: Diagnosis not present

## 2023-07-15 DIAGNOSIS — I10 Essential (primary) hypertension: Secondary | ICD-10-CM | POA: Diagnosis not present

## 2023-07-15 DIAGNOSIS — R110 Nausea: Secondary | ICD-10-CM | POA: Diagnosis not present

## 2023-07-15 DIAGNOSIS — E7800 Pure hypercholesterolemia, unspecified: Secondary | ICD-10-CM | POA: Diagnosis not present

## 2023-07-15 DIAGNOSIS — F0280 Dementia in other diseases classified elsewhere without behavioral disturbance: Secondary | ICD-10-CM | POA: Diagnosis not present

## 2023-07-15 DIAGNOSIS — M159 Polyosteoarthritis, unspecified: Secondary | ICD-10-CM | POA: Diagnosis not present

## 2023-07-15 DIAGNOSIS — R42 Dizziness and giddiness: Secondary | ICD-10-CM | POA: Diagnosis not present

## 2023-07-15 DIAGNOSIS — E1159 Type 2 diabetes mellitus with other circulatory complications: Secondary | ICD-10-CM | POA: Diagnosis not present

## 2023-07-24 DIAGNOSIS — F419 Anxiety disorder, unspecified: Secondary | ICD-10-CM | POA: Diagnosis not present

## 2023-07-24 DIAGNOSIS — F5105 Insomnia due to other mental disorder: Secondary | ICD-10-CM | POA: Diagnosis not present

## 2023-07-24 DIAGNOSIS — F4381 Prolonged grief disorder: Secondary | ICD-10-CM | POA: Diagnosis not present

## 2023-07-24 DIAGNOSIS — G3183 Dementia with Lewy bodies: Secondary | ICD-10-CM | POA: Diagnosis not present

## 2023-07-29 DIAGNOSIS — E7800 Pure hypercholesterolemia, unspecified: Secondary | ICD-10-CM | POA: Diagnosis not present

## 2023-07-29 DIAGNOSIS — M25512 Pain in left shoulder: Secondary | ICD-10-CM | POA: Diagnosis not present

## 2023-07-29 DIAGNOSIS — F419 Anxiety disorder, unspecified: Secondary | ICD-10-CM | POA: Diagnosis not present

## 2023-07-29 DIAGNOSIS — M159 Polyosteoarthritis, unspecified: Secondary | ICD-10-CM | POA: Diagnosis not present

## 2023-07-29 DIAGNOSIS — R42 Dizziness and giddiness: Secondary | ICD-10-CM | POA: Diagnosis not present

## 2023-07-29 DIAGNOSIS — F319 Bipolar disorder, unspecified: Secondary | ICD-10-CM | POA: Diagnosis not present

## 2023-07-29 DIAGNOSIS — E1159 Type 2 diabetes mellitus with other circulatory complications: Secondary | ICD-10-CM | POA: Diagnosis not present

## 2023-07-29 DIAGNOSIS — R110 Nausea: Secondary | ICD-10-CM | POA: Diagnosis not present

## 2023-07-29 DIAGNOSIS — I10 Essential (primary) hypertension: Secondary | ICD-10-CM | POA: Diagnosis not present

## 2023-07-29 DIAGNOSIS — F5104 Psychophysiologic insomnia: Secondary | ICD-10-CM | POA: Diagnosis not present

## 2023-07-29 DIAGNOSIS — G3183 Dementia with Lewy bodies: Secondary | ICD-10-CM | POA: Diagnosis not present

## 2023-07-29 DIAGNOSIS — F0280 Dementia in other diseases classified elsewhere without behavioral disturbance: Secondary | ICD-10-CM | POA: Diagnosis not present

## 2023-08-26 ENCOUNTER — Ambulatory Visit: Admitting: Neurology

## 2023-09-04 DIAGNOSIS — F0284 Dementia in other diseases classified elsewhere, unspecified severity, with anxiety: Secondary | ICD-10-CM | POA: Diagnosis not present

## 2023-09-04 DIAGNOSIS — F0283 Dementia in other diseases classified elsewhere, unspecified severity, with mood disturbance: Secondary | ICD-10-CM | POA: Diagnosis not present

## 2023-09-04 DIAGNOSIS — F5105 Insomnia due to other mental disorder: Secondary | ICD-10-CM | POA: Diagnosis not present

## 2023-09-04 DIAGNOSIS — F4381 Prolonged grief disorder: Secondary | ICD-10-CM | POA: Diagnosis not present

## 2023-09-04 DIAGNOSIS — F32A Depression, unspecified: Secondary | ICD-10-CM | POA: Diagnosis not present

## 2023-09-04 DIAGNOSIS — G3183 Dementia with Lewy bodies: Secondary | ICD-10-CM | POA: Diagnosis not present

## 2023-09-04 DIAGNOSIS — F419 Anxiety disorder, unspecified: Secondary | ICD-10-CM | POA: Diagnosis not present

## 2023-09-24 DIAGNOSIS — F4321 Adjustment disorder with depressed mood: Secondary | ICD-10-CM | POA: Diagnosis not present

## 2023-10-03 DIAGNOSIS — F5105 Insomnia due to other mental disorder: Secondary | ICD-10-CM | POA: Diagnosis not present

## 2023-10-03 DIAGNOSIS — F419 Anxiety disorder, unspecified: Secondary | ICD-10-CM | POA: Diagnosis not present

## 2023-10-03 DIAGNOSIS — F0284 Dementia in other diseases classified elsewhere, unspecified severity, with anxiety: Secondary | ICD-10-CM | POA: Diagnosis not present

## 2023-10-03 DIAGNOSIS — F4381 Prolonged grief disorder: Secondary | ICD-10-CM | POA: Diagnosis not present

## 2023-10-03 DIAGNOSIS — F0283 Dementia in other diseases classified elsewhere, unspecified severity, with mood disturbance: Secondary | ICD-10-CM | POA: Diagnosis not present

## 2023-10-03 DIAGNOSIS — F32A Depression, unspecified: Secondary | ICD-10-CM | POA: Diagnosis not present

## 2023-10-03 DIAGNOSIS — G3183 Dementia with Lewy bodies: Secondary | ICD-10-CM | POA: Diagnosis not present

## 2023-10-06 ENCOUNTER — Ambulatory Visit: Admitting: Neurology

## 2023-10-06 ENCOUNTER — Encounter: Payer: Self-pay | Admitting: Neurology
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
## 2023-10-22 DIAGNOSIS — I10 Essential (primary) hypertension: Secondary | ICD-10-CM | POA: Diagnosis not present

## 2023-10-22 DIAGNOSIS — F5104 Psychophysiologic insomnia: Secondary | ICD-10-CM | POA: Diagnosis not present

## 2023-10-22 DIAGNOSIS — F419 Anxiety disorder, unspecified: Secondary | ICD-10-CM | POA: Diagnosis not present

## 2023-10-22 DIAGNOSIS — F319 Bipolar disorder, unspecified: Secondary | ICD-10-CM | POA: Diagnosis not present

## 2023-10-22 DIAGNOSIS — T7840XA Allergy, unspecified, initial encounter: Secondary | ICD-10-CM | POA: Diagnosis not present

## 2023-10-22 DIAGNOSIS — M159 Polyosteoarthritis, unspecified: Secondary | ICD-10-CM | POA: Diagnosis not present

## 2023-10-22 DIAGNOSIS — R110 Nausea: Secondary | ICD-10-CM | POA: Diagnosis not present

## 2023-10-22 DIAGNOSIS — E7800 Pure hypercholesterolemia, unspecified: Secondary | ICD-10-CM | POA: Diagnosis not present

## 2023-10-22 DIAGNOSIS — R42 Dizziness and giddiness: Secondary | ICD-10-CM | POA: Diagnosis not present

## 2023-10-22 DIAGNOSIS — F0280 Dementia in other diseases classified elsewhere without behavioral disturbance: Secondary | ICD-10-CM | POA: Diagnosis not present

## 2023-10-22 DIAGNOSIS — G3183 Dementia with Lewy bodies: Secondary | ICD-10-CM | POA: Diagnosis not present

## 2023-10-22 DIAGNOSIS — E1159 Type 2 diabetes mellitus with other circulatory complications: Secondary | ICD-10-CM | POA: Diagnosis not present

## 2023-10-23 DIAGNOSIS — F32A Depression, unspecified: Secondary | ICD-10-CM | POA: Diagnosis not present

## 2023-10-23 DIAGNOSIS — F0284 Dementia in other diseases classified elsewhere, unspecified severity, with anxiety: Secondary | ICD-10-CM | POA: Diagnosis not present

## 2023-10-23 DIAGNOSIS — F0283 Dementia in other diseases classified elsewhere, unspecified severity, with mood disturbance: Secondary | ICD-10-CM | POA: Diagnosis not present

## 2023-10-23 DIAGNOSIS — F4381 Prolonged grief disorder: Secondary | ICD-10-CM | POA: Diagnosis not present

## 2023-10-23 DIAGNOSIS — F419 Anxiety disorder, unspecified: Secondary | ICD-10-CM | POA: Diagnosis not present

## 2023-10-23 DIAGNOSIS — F5105 Insomnia due to other mental disorder: Secondary | ICD-10-CM | POA: Diagnosis not present

## 2023-10-23 DIAGNOSIS — G3183 Dementia with Lewy bodies: Secondary | ICD-10-CM | POA: Diagnosis not present

## 2023-11-10 DIAGNOSIS — M2042 Other hammer toe(s) (acquired), left foot: Secondary | ICD-10-CM | POA: Diagnosis not present

## 2023-11-10 DIAGNOSIS — L600 Ingrowing nail: Secondary | ICD-10-CM | POA: Diagnosis not present

## 2023-11-10 DIAGNOSIS — M2030 Hallux varus (acquired), unspecified foot: Secondary | ICD-10-CM | POA: Diagnosis not present

## 2023-11-10 DIAGNOSIS — M2041 Other hammer toe(s) (acquired), right foot: Secondary | ICD-10-CM | POA: Diagnosis not present

## 2023-11-10 DIAGNOSIS — E1140 Type 2 diabetes mellitus with diabetic neuropathy, unspecified: Secondary | ICD-10-CM | POA: Diagnosis not present

## 2023-11-10 DIAGNOSIS — B351 Tinea unguium: Secondary | ICD-10-CM | POA: Diagnosis not present

## 2023-11-10 DIAGNOSIS — M79672 Pain in left foot: Secondary | ICD-10-CM | POA: Diagnosis not present

## 2023-11-10 DIAGNOSIS — I998 Other disorder of circulatory system: Secondary | ICD-10-CM | POA: Diagnosis not present

## 2023-11-10 DIAGNOSIS — M79671 Pain in right foot: Secondary | ICD-10-CM | POA: Diagnosis not present

## 2023-11-26 ENCOUNTER — Telehealth: Payer: Self-pay | Admitting: Neurology

## 2023-11-26 NOTE — Telephone Encounter (Signed)
 Appointment has been r/s w/ wait list

## 2023-11-29 DIAGNOSIS — R079 Chest pain, unspecified: Secondary | ICD-10-CM | POA: Diagnosis not present

## 2023-11-29 DIAGNOSIS — R9431 Abnormal electrocardiogram [ECG] [EKG]: Secondary | ICD-10-CM | POA: Diagnosis not present

## 2023-12-03 DIAGNOSIS — G3183 Dementia with Lewy bodies: Secondary | ICD-10-CM | POA: Diagnosis not present

## 2023-12-03 DIAGNOSIS — F411 Generalized anxiety disorder: Secondary | ICD-10-CM | POA: Diagnosis not present

## 2023-12-03 DIAGNOSIS — F4381 Prolonged grief disorder: Secondary | ICD-10-CM | POA: Diagnosis not present

## 2023-12-03 DIAGNOSIS — F329 Major depressive disorder, single episode, unspecified: Secondary | ICD-10-CM | POA: Diagnosis not present

## 2023-12-03 DIAGNOSIS — F5105 Insomnia due to other mental disorder: Secondary | ICD-10-CM | POA: Diagnosis not present

## 2023-12-17 DIAGNOSIS — E1159 Type 2 diabetes mellitus with other circulatory complications: Secondary | ICD-10-CM | POA: Diagnosis not present

## 2023-12-17 DIAGNOSIS — E7800 Pure hypercholesterolemia, unspecified: Secondary | ICD-10-CM | POA: Diagnosis not present

## 2023-12-17 DIAGNOSIS — F419 Anxiety disorder, unspecified: Secondary | ICD-10-CM | POA: Diagnosis not present

## 2023-12-17 DIAGNOSIS — I4891 Unspecified atrial fibrillation: Secondary | ICD-10-CM | POA: Diagnosis not present

## 2023-12-17 DIAGNOSIS — I2510 Atherosclerotic heart disease of native coronary artery without angina pectoris: Secondary | ICD-10-CM | POA: Diagnosis not present

## 2023-12-17 DIAGNOSIS — R7309 Other abnormal glucose: Secondary | ICD-10-CM | POA: Diagnosis not present

## 2023-12-17 DIAGNOSIS — R42 Dizziness and giddiness: Secondary | ICD-10-CM | POA: Diagnosis not present

## 2023-12-17 DIAGNOSIS — G3183 Dementia with Lewy bodies: Secondary | ICD-10-CM | POA: Diagnosis not present

## 2023-12-17 DIAGNOSIS — R110 Nausea: Secondary | ICD-10-CM | POA: Diagnosis not present

## 2023-12-17 DIAGNOSIS — T7840XA Allergy, unspecified, initial encounter: Secondary | ICD-10-CM | POA: Diagnosis not present

## 2023-12-17 DIAGNOSIS — F319 Bipolar disorder, unspecified: Secondary | ICD-10-CM | POA: Diagnosis not present

## 2023-12-17 DIAGNOSIS — M159 Polyosteoarthritis, unspecified: Secondary | ICD-10-CM | POA: Diagnosis not present

## 2023-12-17 DIAGNOSIS — I10 Essential (primary) hypertension: Secondary | ICD-10-CM | POA: Diagnosis not present

## 2023-12-17 DIAGNOSIS — F5104 Psychophysiologic insomnia: Secondary | ICD-10-CM | POA: Diagnosis not present

## 2023-12-22 DIAGNOSIS — F419 Anxiety disorder, unspecified: Secondary | ICD-10-CM | POA: Diagnosis not present

## 2023-12-22 DIAGNOSIS — R110 Nausea: Secondary | ICD-10-CM | POA: Diagnosis not present

## 2023-12-22 DIAGNOSIS — F5104 Psychophysiologic insomnia: Secondary | ICD-10-CM | POA: Diagnosis not present

## 2023-12-22 DIAGNOSIS — F319 Bipolar disorder, unspecified: Secondary | ICD-10-CM | POA: Diagnosis not present

## 2023-12-22 DIAGNOSIS — E1159 Type 2 diabetes mellitus with other circulatory complications: Secondary | ICD-10-CM | POA: Diagnosis not present

## 2023-12-22 DIAGNOSIS — E7800 Pure hypercholesterolemia, unspecified: Secondary | ICD-10-CM | POA: Diagnosis not present

## 2023-12-22 DIAGNOSIS — I10 Essential (primary) hypertension: Secondary | ICD-10-CM | POA: Diagnosis not present

## 2023-12-22 DIAGNOSIS — G3183 Dementia with Lewy bodies: Secondary | ICD-10-CM | POA: Diagnosis not present

## 2023-12-22 DIAGNOSIS — T7840XA Allergy, unspecified, initial encounter: Secondary | ICD-10-CM | POA: Diagnosis not present

## 2023-12-22 DIAGNOSIS — R42 Dizziness and giddiness: Secondary | ICD-10-CM | POA: Diagnosis not present

## 2023-12-22 DIAGNOSIS — M159 Polyosteoarthritis, unspecified: Secondary | ICD-10-CM | POA: Diagnosis not present

## 2023-12-22 DIAGNOSIS — R7309 Other abnormal glucose: Secondary | ICD-10-CM | POA: Diagnosis not present

## 2024-01-01 DIAGNOSIS — F4381 Prolonged grief disorder: Secondary | ICD-10-CM | POA: Diagnosis not present

## 2024-01-01 DIAGNOSIS — F0284 Dementia in other diseases classified elsewhere, unspecified severity, with anxiety: Secondary | ICD-10-CM | POA: Diagnosis not present

## 2024-01-01 DIAGNOSIS — F0283 Dementia in other diseases classified elsewhere, unspecified severity, with mood disturbance: Secondary | ICD-10-CM | POA: Diagnosis not present

## 2024-01-01 DIAGNOSIS — F329 Major depressive disorder, single episode, unspecified: Secondary | ICD-10-CM | POA: Diagnosis not present

## 2024-01-01 DIAGNOSIS — G4700 Insomnia, unspecified: Secondary | ICD-10-CM | POA: Diagnosis not present

## 2024-01-01 DIAGNOSIS — F411 Generalized anxiety disorder: Secondary | ICD-10-CM | POA: Diagnosis not present

## 2024-01-01 DIAGNOSIS — G3183 Dementia with Lewy bodies: Secondary | ICD-10-CM | POA: Diagnosis not present

## 2024-01-01 DIAGNOSIS — F4321 Adjustment disorder with depressed mood: Secondary | ICD-10-CM | POA: Diagnosis not present

## 2024-01-02 DIAGNOSIS — I4891 Unspecified atrial fibrillation: Secondary | ICD-10-CM | POA: Diagnosis not present

## 2024-01-06 DIAGNOSIS — E1159 Type 2 diabetes mellitus with other circulatory complications: Secondary | ICD-10-CM | POA: Diagnosis not present

## 2024-01-06 DIAGNOSIS — F5104 Psychophysiologic insomnia: Secondary | ICD-10-CM | POA: Diagnosis not present

## 2024-01-06 DIAGNOSIS — G3183 Dementia with Lewy bodies: Secondary | ICD-10-CM | POA: Diagnosis not present

## 2024-01-06 DIAGNOSIS — R42 Dizziness and giddiness: Secondary | ICD-10-CM | POA: Diagnosis not present

## 2024-01-06 DIAGNOSIS — M159 Polyosteoarthritis, unspecified: Secondary | ICD-10-CM | POA: Diagnosis not present

## 2024-01-06 DIAGNOSIS — R110 Nausea: Secondary | ICD-10-CM | POA: Diagnosis not present

## 2024-01-06 DIAGNOSIS — T7840XA Allergy, unspecified, initial encounter: Secondary | ICD-10-CM | POA: Diagnosis not present

## 2024-01-06 DIAGNOSIS — E7800 Pure hypercholesterolemia, unspecified: Secondary | ICD-10-CM | POA: Diagnosis not present

## 2024-01-06 DIAGNOSIS — R7309 Other abnormal glucose: Secondary | ICD-10-CM | POA: Diagnosis not present

## 2024-01-06 DIAGNOSIS — F319 Bipolar disorder, unspecified: Secondary | ICD-10-CM | POA: Diagnosis not present

## 2024-01-06 DIAGNOSIS — F419 Anxiety disorder, unspecified: Secondary | ICD-10-CM | POA: Diagnosis not present

## 2024-01-06 DIAGNOSIS — I10 Essential (primary) hypertension: Secondary | ICD-10-CM | POA: Diagnosis not present

## 2024-01-19 DIAGNOSIS — I10 Essential (primary) hypertension: Secondary | ICD-10-CM | POA: Diagnosis not present

## 2024-01-19 DIAGNOSIS — I2510 Atherosclerotic heart disease of native coronary artery without angina pectoris: Secondary | ICD-10-CM | POA: Diagnosis not present

## 2024-01-19 DIAGNOSIS — I4891 Unspecified atrial fibrillation: Secondary | ICD-10-CM | POA: Diagnosis not present

## 2024-01-29 DIAGNOSIS — F0283 Dementia in other diseases classified elsewhere, unspecified severity, with mood disturbance: Secondary | ICD-10-CM | POA: Diagnosis not present

## 2024-01-29 DIAGNOSIS — F411 Generalized anxiety disorder: Secondary | ICD-10-CM | POA: Diagnosis not present

## 2024-01-29 DIAGNOSIS — G4700 Insomnia, unspecified: Secondary | ICD-10-CM | POA: Diagnosis not present

## 2024-01-29 DIAGNOSIS — G3183 Dementia with Lewy bodies: Secondary | ICD-10-CM | POA: Diagnosis not present

## 2024-01-29 DIAGNOSIS — F0284 Dementia in other diseases classified elsewhere, unspecified severity, with anxiety: Secondary | ICD-10-CM | POA: Diagnosis not present

## 2024-01-29 DIAGNOSIS — F4381 Prolonged grief disorder: Secondary | ICD-10-CM | POA: Diagnosis not present

## 2024-01-29 DIAGNOSIS — F329 Major depressive disorder, single episode, unspecified: Secondary | ICD-10-CM | POA: Diagnosis not present

## 2024-02-12 DIAGNOSIS — L600 Ingrowing nail: Secondary | ICD-10-CM | POA: Diagnosis not present

## 2024-02-12 DIAGNOSIS — M79671 Pain in right foot: Secondary | ICD-10-CM | POA: Diagnosis not present

## 2024-02-12 DIAGNOSIS — M2041 Other hammer toe(s) (acquired), right foot: Secondary | ICD-10-CM | POA: Diagnosis not present

## 2024-02-12 DIAGNOSIS — M2042 Other hammer toe(s) (acquired), left foot: Secondary | ICD-10-CM | POA: Diagnosis not present

## 2024-02-12 DIAGNOSIS — I998 Other disorder of circulatory system: Secondary | ICD-10-CM | POA: Diagnosis not present

## 2024-02-12 DIAGNOSIS — M79672 Pain in left foot: Secondary | ICD-10-CM | POA: Diagnosis not present

## 2024-02-12 DIAGNOSIS — B351 Tinea unguium: Secondary | ICD-10-CM | POA: Diagnosis not present

## 2024-02-12 DIAGNOSIS — M2030 Hallux varus (acquired), unspecified foot: Secondary | ICD-10-CM | POA: Diagnosis not present

## 2024-02-12 DIAGNOSIS — E1140 Type 2 diabetes mellitus with diabetic neuropathy, unspecified: Secondary | ICD-10-CM | POA: Diagnosis not present

## 2024-03-01 DIAGNOSIS — I4891 Unspecified atrial fibrillation: Secondary | ICD-10-CM | POA: Diagnosis not present

## 2024-03-01 DIAGNOSIS — E7800 Pure hypercholesterolemia, unspecified: Secondary | ICD-10-CM | POA: Diagnosis not present

## 2024-03-01 DIAGNOSIS — I2510 Atherosclerotic heart disease of native coronary artery without angina pectoris: Secondary | ICD-10-CM | POA: Diagnosis not present

## 2024-03-01 DIAGNOSIS — I10 Essential (primary) hypertension: Secondary | ICD-10-CM | POA: Diagnosis not present

## 2024-03-03 DIAGNOSIS — F4321 Adjustment disorder with depressed mood: Secondary | ICD-10-CM | POA: Diagnosis not present

## 2024-03-12 DIAGNOSIS — F4321 Adjustment disorder with depressed mood: Secondary | ICD-10-CM | POA: Diagnosis not present

## 2024-03-16 ENCOUNTER — Ambulatory Visit: Admitting: Neurology

## 2024-04-27 ENCOUNTER — Ambulatory Visit: Admitting: Neurology

## 2024-06-28 ENCOUNTER — Ambulatory Visit: Admitting: Neurology
